# Patient Record
Sex: Male | Born: 1965 | Race: White | Hispanic: No | Marital: Married | Smoking: Never smoker
Health system: Southern US, Community
[De-identification: ages and names within clinical notes are randomized; demographics above are authoritative.]

## PROBLEM LIST (undated history)

## (undated) DIAGNOSIS — S83209A Unspecified tear of unspecified meniscus, current injury, unspecified knee, initial encounter: Secondary | ICD-10-CM

## (undated) DIAGNOSIS — I10 Essential (primary) hypertension: Secondary | ICD-10-CM

## (undated) DIAGNOSIS — E119 Type 2 diabetes mellitus without complications: Secondary | ICD-10-CM

## (undated) DIAGNOSIS — M1990 Unspecified osteoarthritis, unspecified site: Secondary | ICD-10-CM

## (undated) DIAGNOSIS — E785 Hyperlipidemia, unspecified: Secondary | ICD-10-CM

## (undated) HISTORY — DX: Hyperlipidemia, unspecified: E78.5

## (undated) HISTORY — DX: Essential (primary) hypertension: I10

## (undated) HISTORY — PX: KNEE ARTHROSCOPY: SHX127

---

## 2012-09-16 DIAGNOSIS — E119 Type 2 diabetes mellitus without complications: Secondary | ICD-10-CM

## 2012-09-16 HISTORY — DX: Type 2 diabetes mellitus without complications: E11.9

## 2013-02-16 DIAGNOSIS — S83209A Unspecified tear of unspecified meniscus, current injury, unspecified knee, initial encounter: Secondary | ICD-10-CM

## 2013-02-16 HISTORY — DX: Unspecified tear of unspecified meniscus, current injury, unspecified knee, initial encounter: S83.209A

## 2013-03-03 ENCOUNTER — Encounter (HOSPITAL_COMMUNITY): Payer: Self-pay | Admitting: Pharmacy Technician

## 2013-03-03 ENCOUNTER — Other Ambulatory Visit (HOSPITAL_COMMUNITY): Payer: Self-pay | Admitting: Orthopaedic Surgery

## 2013-03-05 ENCOUNTER — Encounter (HOSPITAL_COMMUNITY): Payer: Self-pay

## 2013-03-05 ENCOUNTER — Encounter (HOSPITAL_COMMUNITY)
Admission: RE | Admit: 2013-03-05 | Discharge: 2013-03-05 | Disposition: A | Discharge status: Home / Self Care | Payer: BC Managed Care – PPO | Source: Ambulatory Visit | Attending: Orthopaedic Surgery | Admitting: Orthopaedic Surgery

## 2013-03-05 ENCOUNTER — Other Ambulatory Visit (HOSPITAL_COMMUNITY): Payer: Self-pay | Admitting: *Deleted

## 2013-03-05 DIAGNOSIS — Z01812 Encounter for preprocedural laboratory examination: Secondary | ICD-10-CM | POA: Insufficient documentation

## 2013-03-05 DIAGNOSIS — Z01810 Encounter for preprocedural cardiovascular examination: Secondary | ICD-10-CM | POA: Insufficient documentation

## 2013-03-05 HISTORY — DX: Type 2 diabetes mellitus without complications: E11.9

## 2013-03-05 HISTORY — DX: Unspecified osteoarthritis, unspecified site: M19.90

## 2013-03-05 HISTORY — DX: Unspecified tear of unspecified meniscus, current injury, unspecified knee, initial encounter: S83.209A

## 2013-03-05 LAB — BASIC METABOLIC PANEL
BUN: 16 mg/dL (ref 6–23)
CO2: 28 mEq/L (ref 19–32)
Chloride: 102 mEq/L (ref 96–112)
GFR calc Af Amer: >90 mL/min (ref >90)
GFR calc non Af Amer: >90 mL/min (ref >90)
Potassium: 4.1 mEq/L (ref 3.5–5.1)
Sodium: 138 mEq/L (ref 135–145)

## 2013-03-05 LAB — CBC
HCT: 45.8 % (ref 39.0–52.0)
Hemoglobin: 16.3 g/dL (ref 13.0–17.0)
MCHC: 35.6 g/dL (ref 30.0–36.0)
RBC: 5.31 MIL/uL (ref 4.22–5.81)
RDW: 12.7 % (ref 11.5–15.5)
WBC: 4.9 10*3/uL (ref 4.0–10.5)

## 2013-03-05 NOTE — Pre-Procedure Instructions (Signed)
Jason Chang  03/05/2013   Your procedure is scheduled on:  Tuesday, March 10, 2013 at 11:30 AM.   Report to [facility] Entrance "A" Admitting Office at 9:30 AM.   Call this number if you have problems the morning of surgery: [phone]   Remember:   Do not eat food or drink liquids after midnight Monday, 03/09/13.   Take these medicines the morning of surgery with A SIP OF WATER: None   Do not wear jewelry.  Do not wear lotions, powders, or cologne. You may wear deodorant.  Men may shave face and neck.  Do not bring valuables to the hospital.  [facility] is not responsible                  for any belongings or valuables.               Contacts, dentures or bridgework may not be worn into surgery.  Leave suitcase in the car. After surgery it may be brought to your room.  For patients admitted to the hospital, discharge time is determined by your                treatment team.               Patients discharged the day of surgery will not be allowed to drive  home.  Name and phone number of your driver: Family/friend   Special Instructions: Shower using CHG 2 nights before surgery and the night before surgery.  If you shower the day of surgery use CHG.  Use special wash - you have one bottle of CHG for all showers.  You should use approximately 1/3 of the bottle for each shower.   Please read over the following fact sheets that you were given: Pain Booklet, Coughing and Deep Breathing and Surgical Site Infection Prevention

## 2013-03-09 MED ORDER — CEFAZOLIN SODIUM-DEXTROSE 2-3 GM-% IV SOLR
2.0000 g | INTRAVENOUS | Status: AC
  Administered 2013-03-10: 2 g via INTRAVENOUS
  Filled 2013-03-09: qty 50

## 2013-03-10 ENCOUNTER — Encounter (HOSPITAL_COMMUNITY): Payer: BC Managed Care – PPO | Admitting: Certified Registered Nurse Anesthetist

## 2013-03-10 ENCOUNTER — Encounter (HOSPITAL_COMMUNITY)
Admission: RE | Disposition: A | Discharge status: Home / Self Care | Payer: Self-pay | Source: Ambulatory Visit | Attending: Orthopaedic Surgery

## 2013-03-10 ENCOUNTER — Ambulatory Visit (HOSPITAL_COMMUNITY): Payer: BC Managed Care – PPO | Admitting: Certified Registered Nurse Anesthetist

## 2013-03-10 ENCOUNTER — Encounter (HOSPITAL_COMMUNITY): Payer: Self-pay | Admitting: Certified Registered Nurse Anesthetist

## 2013-03-10 ENCOUNTER — Ambulatory Visit (HOSPITAL_COMMUNITY)
Admission: RE | Admit: 2013-03-10 | Discharge: 2013-03-10 | Disposition: A | Discharge status: Home / Self Care | Payer: BC Managed Care – PPO | Source: Ambulatory Visit | Attending: Orthopaedic Surgery | Admitting: Orthopaedic Surgery

## 2013-03-10 DIAGNOSIS — IMO0002 Reserved for concepts with insufficient information to code with codable children: Secondary | ICD-10-CM | POA: Insufficient documentation

## 2013-03-10 DIAGNOSIS — E119 Type 2 diabetes mellitus without complications: Secondary | ICD-10-CM | POA: Insufficient documentation

## 2013-03-10 DIAGNOSIS — M2240 Chondromalacia patellae, unspecified knee: Secondary | ICD-10-CM | POA: Insufficient documentation

## 2013-03-10 DIAGNOSIS — S838X2A Sprain of other specified parts of left knee, initial encounter: Secondary | ICD-10-CM

## 2013-03-10 DIAGNOSIS — X58XXXA Exposure to other specified factors, initial encounter: Secondary | ICD-10-CM | POA: Insufficient documentation

## 2013-03-10 HISTORY — PX: KNEE ARTHROSCOPY: SHX127

## 2013-03-10 LAB — GLUCOSE, CAPILLARY
Glucose-Capillary: 157 mg/dL — ABNORMAL HIGH (ref 70–99)
Glucose-Capillary: 68 mg/dL — ABNORMAL LOW (ref 70–99)
Glucose-Capillary: 77 mg/dL (ref 70–99)
Glucose-Capillary: 119 mg/dL — ABNORMAL HIGH (ref 70–99)

## 2013-03-10 SURGERY — ARTHROSCOPY, KNEE
Anesthesia: General | Site: Knee | Laterality: Left

## 2013-03-10 MED ORDER — HYDROCODONE-ACETAMINOPHEN 5-325 MG PO TABS: 1.0000 | ORAL_TABLET | ORAL | Status: DC | PRN

## 2013-03-10 MED ORDER — CLONIDINE HCL (ANALGESIA) 100 MCG/ML EP SOLN
EPIDURAL | Status: DC | PRN
  Administered 2013-03-10: 1 mL

## 2013-03-10 MED ORDER — SODIUM CHLORIDE 0.9 % IR SOLN
Status: DC | PRN
  Administered 2013-03-10: 3000 mL

## 2013-03-10 MED ORDER — LIDOCAINE HCL (CARDIAC) 20 MG/ML IV SOLN
INTRAVENOUS | Status: DC | PRN
  Administered 2013-03-10: 80 mg via INTRAVENOUS

## 2013-03-10 MED ORDER — LACTATED RINGERS IV SOLN
INTRAVENOUS | Status: DC | PRN
  Administered 2013-03-10: 12:00:00 via INTRAVENOUS

## 2013-03-10 MED ORDER — MORPHINE SULFATE 4 MG/ML IJ SOLN
INTRAMUSCULAR | Status: AC
  Filled 2013-03-10: qty 1

## 2013-03-10 MED ORDER — PROMETHAZINE HCL 12.5 MG PO TABS
12.5000 mg | ORAL_TABLET | Freq: Four times a day (QID) | ORAL | Status: DC | PRN
Start: 2013-03-10 — End: 2015-07-21

## 2013-03-10 MED ORDER — BUPIVACAINE HCL (PF) 0.25 % IJ SOLN
INTRAMUSCULAR | Status: AC
  Filled 2013-03-10: qty 30

## 2013-03-10 MED ORDER — MORPHINE SULFATE 4 MG/ML IJ SOLN
INTRAMUSCULAR | Status: DC | PRN
  Administered 2013-03-10: 4 mg via SUBCUTANEOUS

## 2013-03-10 MED ORDER — HYDROMORPHONE HCL PF 1 MG/ML IJ SOLN: 0.2500 mg | INTRAMUSCULAR | Status: DC | PRN

## 2013-03-10 MED ORDER — BUPIVACAINE HCL (PF) 0.25 % IJ SOLN
INTRAMUSCULAR | Status: DC | PRN
  Administered 2013-03-10: 18 mL

## 2013-03-10 MED ORDER — PROPOFOL 10 MG/ML IV BOLUS
INTRAVENOUS | Status: DC | PRN
  Administered 2013-03-10: 200 mg via INTRAVENOUS

## 2013-03-10 MED ORDER — DEXTROSE 50 % IV SOLN
INTRAVENOUS | Status: AC
  Filled 2013-03-10: qty 50

## 2013-03-10 MED ORDER — LACTATED RINGERS IV SOLN
INTRAVENOUS | Status: DC
  Administered 2013-03-10: 10:00:00 via INTRAVENOUS

## 2013-03-10 MED ORDER — BUPIVACAINE-EPINEPHRINE (PF) 0.5% -1:200000 IJ SOLN
INTRAMUSCULAR | Status: AC
  Filled 2013-03-10: qty 10

## 2013-03-10 MED ORDER — CLONIDINE HCL (ANALGESIA) 100 MCG/ML EP SOLN
150.0000 ug | EPIDURAL | Status: DC
  Filled 2013-03-10: qty 1.5

## 2013-03-10 MED ORDER — DEXTROSE 50 % IV SOLN
1.0000 | Freq: Once | INTRAVENOUS | Status: AC
  Administered 2013-03-10: 50 mL via INTRAVENOUS

## 2013-03-10 MED ORDER — DEXTROSE 50 % IV SOLN
INTRAVENOUS | Status: AC
  Administered 2013-03-10: 10:00:00
  Filled 2013-03-10: qty 50

## 2013-03-10 MED ORDER — MIDAZOLAM HCL 5 MG/5ML IJ SOLN
INTRAMUSCULAR | Status: DC | PRN
  Administered 2013-03-10: 2 mg via INTRAVENOUS

## 2013-03-10 MED ORDER — FENTANYL CITRATE 0.05 MG/ML IJ SOLN
INTRAMUSCULAR | Status: DC | PRN
  Administered 2013-03-10: 50 ug via INTRAVENOUS
  Administered 2013-03-10: 25 ug via INTRAVENOUS
  Administered 2013-03-10: 50 ug via INTRAVENOUS

## 2013-03-10 SURGICAL SUPPLY — 39 items
BANDAGE ELASTIC 6 VELCRO ST LF (GAUZE/BANDAGES/DRESSINGS) ×2 IMPLANT
BANDAGE ESMARK 6X9 LF (GAUZE/BANDAGES/DRESSINGS) IMPLANT
BLADE CUDA 5.5 (BLADE) IMPLANT
BLADE CUTTER GATOR 3.5 (BLADE) ×2 IMPLANT
BLADE SURG 11 STRL SS (BLADE) IMPLANT
BLADE SURG ROTATE 9660 (MISCELLANEOUS) ×2 IMPLANT
BNDG ESMARK 6X9 LF (GAUZE/BANDAGES/DRESSINGS)
BUR OVAL 6.0 (BURR) IMPLANT
CLOTH BEACON ORANGE TIMEOUT ST (SAFETY) ×2 IMPLANT
COVER SURGICAL LIGHT HANDLE (MISCELLANEOUS) ×2 IMPLANT
CUFF TOURNIQUET SINGLE 34IN LL (TOURNIQUET CUFF) IMPLANT
CUFF TOURNIQUET SINGLE 44IN (TOURNIQUET CUFF) IMPLANT
DRAPE ARTHROSCOPY W/POUCH 114 (DRAPES) ×2 IMPLANT
DRAPE U-SHAPE 47X51 STRL (DRAPES) ×2 IMPLANT
DRSG PAD ABDOMINAL 8X10 ST (GAUZE/BANDAGES/DRESSINGS) ×2 IMPLANT
DURAPREP 26ML APPLICATOR (WOUND CARE) ×2 IMPLANT
GAUZE XEROFORM 1X8 LF (GAUZE/BANDAGES/DRESSINGS) ×2 IMPLANT
GLOVE BIO SURGEON STRL SZ8 (GLOVE) ×2 IMPLANT
GLOVE BIOGEL PI IND STRL 8 (GLOVE) ×1 IMPLANT
GLOVE BIOGEL PI INDICATOR 8 (GLOVE) ×1
GLOVE ORTHO TXT STRL SZ7.5 (GLOVE) ×2 IMPLANT
GOWN PREVENTION PLUS LG XLONG (DISPOSABLE) IMPLANT
GOWN PREVENTION PLUS XLARGE (GOWN DISPOSABLE) ×6 IMPLANT
GOWN STRL NON-REIN LRG LVL3 (GOWN DISPOSABLE) IMPLANT
KIT ROOM TURNOVER OR (KITS) ×2 IMPLANT
MANIFOLD NEPTUNE II (INSTRUMENTS) IMPLANT
NS IRRIG 1000ML POUR BTL (IV SOLUTION) IMPLANT
PACK ARTHROSCOPY DSU (CUSTOM PROCEDURE TRAY) ×2 IMPLANT
PAD ARMBOARD 7.5X6 YLW CONV (MISCELLANEOUS) ×4 IMPLANT
PADDING CAST COTTON 6X4 STRL (CAST SUPPLIES) ×2 IMPLANT
SET ARTHROSCOPY TUBING (MISCELLANEOUS) ×1
SET ARTHROSCOPY TUBING LN (MISCELLANEOUS) ×1 IMPLANT
SPONGE GAUZE 4X4 12PLY (GAUZE/BANDAGES/DRESSINGS) ×2 IMPLANT
SPONGE LAP 4X18 X RAY DECT (DISPOSABLE) ×2 IMPLANT
SUT ETHILON 3 0 PS 1 (SUTURE) ×2 IMPLANT
TOWEL OR 17X24 6PK STRL BLUE (TOWEL DISPOSABLE) ×2 IMPLANT
TOWEL OR 17X26 10 PK STRL BLUE (TOWEL DISPOSABLE) ×2 IMPLANT
WAND 90 DEG TURBOVAC W/CORD (SURGICAL WAND) IMPLANT
WATER STERILE IRR 1000ML POUR (IV SOLUTION) IMPLANT

## 2013-03-10 NOTE — Preoperative (Signed)
Beta Blockers   Reason not to administer Beta Blockers:Not Applicable 

## 2013-03-10 NOTE — Progress Notes (Signed)
Lunch relief by M. Brande RN 

## 2013-03-10 NOTE — Op Note (Signed)
[name], [name] NO.:  [id number]  MEDICAL RECORD NO.:  [id number]  LOCATION:  [facility]                         FACILITY:  [facility]  PHYSICIAN:  Vanita Panda. Magnus Ivan, M.D.DATE OF BIRTH:  30-Dec-1965  DATE OF PROCEDURE:  03/10/2013 DATE OF DISCHARGE:  03/10/2013                              OPERATIVE REPORT   PREOPERATIVE DIAGNOSIS:  Left knee complex medial meniscal tear, and medial compartment arthritic changes.  POSTOPERATIVE DIAGNOSIS:  Left knee complex medial meniscal tear, and medial compartment arthritic changes.  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  PROCEDURE:  Left knee arthroscopy with debridement and partial medial meniscectomy.  SURGEON:  Doneen Poisson, M.D.  ASSISTING:  Richardean Canal, P.A-C.  ANESTHESIA: 1. General. 2. Local with a mixture of morphine, Marcaine, and clonidine.  BLOOD LOSS:  Minimal.  COMPLICATIONS:  None.  INDICATION:  Jason Chang is a 47 year old active gentleman with a known medial meniscal tear involving a large section of the posterior horn and mid body of the medial meniscus with cartilage thinning on the medial compartment of the knee.  This has become symptomatic for him with locking and catching and knee effusion.  It was confirmed on MRI as well.  At this point, given the failure of conservative treatment, he wishes to proceed with an arthroscopic intervention.  I have explained to him in detail, the risks and benefits of surgery and he does wish to proceed.  PROCEDURE DESCRIPTION:  After informed consent was obtained, appropriate left knee was marked.  He was brought to the operating room, placed supine on the operating  table, general anesthesia was then obtained. His left leg was prepped and draped from the thigh down the ankle with DuraPrep and sterile drapes including sterile  stockinette.  With the bed raised and a lateral leg post utilized, the left knee was flexed off the side of the table.  We inserted an arthroscopic cannula into the knee and placed the camera, went directly to the medial compartment of the knee and found extensive tearing of the medial meniscus as well as cartilage loss on the medial femoral condyle.  Using the arthroscopic shaver, we carried out extensive debridement in the medial compartment of the knee and using up-cutting biters, we were able to perform a partial medial meniscectomy from the posterior root and horn to the mid body.  We did leave remaining meniscal tissue with a shaver also debrided this back to a stable margin and smoothed out the cartilage, went to the middle part intercondylar area of the knee and the ACL and PCL were probed and they were intact with the knee in a figure-of-four position, the lateral side was pristine.  Finally, in the patellofemoral joint, there was minimal chondromalacia of the trochlear groove centrally but this is minimal.  The patella, medial and lateral facets looked great.  We then allowed fluid to lavage to the knee and removed all remnant tissue that was floating around the knee from the knee.  We then inserted a mixture of morphine, clonidine and Marcaine into the knee and closed the portal sites with interrupted nylon  suture.  Xeroform and well-padded sterile dressings was applied.  He was awakened, extubated, and taken to recovery room in stable condition.  All final counts were correct.  There were no complications noted.     Vanita Panda. Magnus Ivan, M.D.     CYB/MEDQ  D:  03/10/2013  T:  03/10/2013  Job:  [id number]  cc:   Richardean Canal, P.A.

## 2013-03-10 NOTE — Anesthesia Procedure Notes (Signed)
Procedure Name: LMA Insertion Date/Time: 03/10/2013 12:12 PM Performed by: Angelica Pou Pre-anesthesia Checklist: Patient identified, Patient being monitored, Emergency Drugs available, Timeout performed and Suction available Patient Re-evaluated:Patient Re-evaluated prior to inductionOxygen Delivery Method: Circle system utilized Preoxygenation: Pre-oxygenation with 100% oxygen Intubation Type: IV induction Ventilation: Mask ventilation without difficulty LMA: LMA inserted LMA Size: 4.0 Number of attempts: 1 Tube secured with: Tape Dental Injury: Teeth and Oropharynx as per pre-operative assessment

## 2013-03-10 NOTE — Progress Notes (Signed)
CBG 53 upon adm to PACU. Dr Randa Evens notified. 1 amp D50 given per her orders. Will cont to monitor.

## 2013-03-10 NOTE — Transfer of Care (Signed)
Immediate Anesthesia Transfer of Care Note  Patient: Jason Chang  Procedure(s) Performed: Procedure(s): LEFT KNEE ARTHROSCOPY WITH PARTIAL MEDIAL MENISCECTOMY (Left)  Patient Location: PACU  Anesthesia Type:General  Level of Consciousness: awake, alert , oriented and patient cooperative  Airway & Oxygen Therapy: Patient Spontanous Breathing and Patient connected to nasal cannula oxygen  Post-op Assessment: Report given to PACU RN, Post -op Vital signs reviewed and stable and Patient moving all extremities  Post vital signs: Reviewed and stable  Complications: No apparent anesthesia complications

## 2013-03-10 NOTE — Anesthesia Postprocedure Evaluation (Signed)
  Anesthesia Post-op Note  Patient: Jason Chang  Procedure(s) Performed: Procedure(s): LEFT KNEE ARTHROSCOPY WITH PARTIAL MEDIAL MENISCECTOMY (Left)  Patient Location: PACU  Anesthesia Type:General  Level of Consciousness: awake  Airway and Oxygen Therapy: Patient Spontanous Breathing  Post-op Pain: mild  Post-op Assessment: Post-op Vital signs reviewed  Post-op Vital Signs: Reviewed  Complications: No apparent anesthesia complications

## 2013-03-10 NOTE — H&P (Signed)
Jason Chang is an 47 y.o. male.   Chief Complaint:   Left knee pain with locking/catching; known meniscal tear HPI:   47 yo male with a left knee injury in which a medial meniscal tear was found on MRI.  Due to his symptoms, a left knee arthroscopy has been recommended.  He understands the risk mainly of infection and DVT and wishes to proceed.  The goals are decreased symptoms and improve left knee function.  Past Medical History  Diagnosis Date  . Diabetes mellitus without complication 09/2012    diet controlled  . Arthritis   . Meniscus tear 02/2013    left    Past Surgical History  Procedure Laterality Date  . Knee arthroscopy Left     No family history on file. Social History:  reports that he has never smoked. He has never used smokeless tobacco. He reports that he does not drink alcohol or use illicit drugs.  Allergies: No Known Allergies  No prescriptions prior to admission    No results found for this or any previous visit (from the past 48 hour(s)). No results found.  Review of Systems  All other systems reviewed and are negative.    There were no vitals taken for this visit. Physical Exam  Constitutional: He is oriented to person, place, and time. He appears well-developed and well-nourished.  HENT:  Head: Normocephalic and atraumatic.  Eyes: EOM are normal. Pupils are equal, round, and reactive to light.  Neck: Normal range of motion. Neck supple.  Cardiovascular: Normal rate and regular rhythm.   Respiratory: Effort normal and breath sounds normal.  GI: Soft. Bowel sounds are normal.  Musculoskeletal:       Left knee: He exhibits effusion. Tenderness found. Medial joint line tenderness noted.  Neurological: He is alert and oriented to person, place, and time.  Skin: Skin is warm and dry.  Psychiatric: He has a normal mood and affect.     Assessment/Plan Left knee with symptomatic medial meniscal tear 1)  To the OR today for a left knee  arthroscopy with debridement and a partial medial meniscectomy as an outpatient  Sahily Biddle Y 03/10/2013, 7:56 AM

## 2013-03-10 NOTE — Brief Op Note (Signed)
03/10/2013  12:56 PM  PATIENT:  Jason Chang  47 y.o. male  PRE-OPERATIVE DIAGNOSIS:  Left knee medial meniscal tear  POST-OPERATIVE DIAGNOSIS:  Left knee medial meniscal tear  PROCEDURE:  Procedure(s): LEFT KNEE ARTHROSCOPY WITH PARTIAL MEDIAL MENISCECTOMY (Left)  SURGEON:  Surgeon(s) and Role:    * Kathryne Hitch, MD - Primary  PHYSICIAN ASSISTANT: Rexene Edison, PA-C  ANESTHESIA:   local and general  EBL:     BLOOD ADMINISTERED:none  DRAINS: none   LOCAL MEDICATIONS USED:  MARCAINE     SPECIMEN:  No Specimen  DISPOSITION OF SPECIMEN:  N/A  COUNTS:  YES  TOURNIQUET:  * No tourniquets in log *  DICTATION: .Other Dictation: Dictation Number [id number]  PLAN OF CARE: Discharge to home after PACU  PATIENT DISPOSITION:  PACU - hemodynamically stable.   Delay start of Pharmacological VTE agent (>24hrs) due to surgical blood loss or risk of bleeding: no

## 2013-03-10 NOTE — Progress Notes (Signed)
Dr.Edwards notified of accu check 68-orders received to give an Amp of D50

## 2013-03-10 NOTE — Anesthesia Preprocedure Evaluation (Signed)
Anesthesia Evaluation  Patient identified by MRN, date of birth, ID band Patient awake    Reviewed: Allergy & Precautions, H&P , NPO status , Patient's Chart, lab work & pertinent test results  Airway Mallampati: II      Dental   Pulmonary          Cardiovascular negative cardio ROS      Neuro/Psych    GI/Hepatic negative GI ROS, Neg liver ROS,   Endo/Other  diabetes  Renal/GU negative Renal ROS     Musculoskeletal   Abdominal   Peds  Hematology   Anesthesia Other Findings   Reproductive/Obstetrics                           Anesthesia Physical Anesthesia Plan  ASA: II  Anesthesia Plan: General   Post-op Pain Management:    Induction: Intravenous  Airway Management Planned: LMA  Additional Equipment:   Intra-op Plan:   Post-operative Plan: Extubation in OR  Informed Consent: I have reviewed the patients History and Physical, chart, labs and discussed the procedure including the risks, benefits and alternatives for the proposed anesthesia with the patient or authorized representative who has indicated his/her understanding and acceptance.   Dental advisory given  Plan Discussed with: CRNA, Anesthesiologist and Surgeon  Anesthesia Plan Comments:         Anesthesia Quick Evaluation

## 2013-03-11 ENCOUNTER — Encounter (HOSPITAL_COMMUNITY): Payer: Self-pay | Admitting: Orthopaedic Surgery

## 2015-07-18 ENCOUNTER — Encounter: Payer: Self-pay | Admitting: Family Medicine

## 2015-07-21 ENCOUNTER — Encounter: Payer: Self-pay | Admitting: Family Medicine

## 2015-07-21 ENCOUNTER — Ambulatory Visit (INDEPENDENT_AMBULATORY_CARE_PROVIDER_SITE_OTHER): Payer: BC Managed Care – PPO | Admitting: Family Medicine

## 2015-07-21 VITALS — BP 170/103 | HR 64 | Temp 97.9°F | Wt 240.1 lb

## 2015-07-21 DIAGNOSIS — IMO0001 Reserved for inherently not codable concepts without codable children: Secondary | ICD-10-CM

## 2015-07-21 DIAGNOSIS — Z8639 Personal history of other endocrine, nutritional and metabolic disease: Secondary | ICD-10-CM | POA: Diagnosis not present

## 2015-07-21 DIAGNOSIS — E119 Type 2 diabetes mellitus without complications: Secondary | ICD-10-CM | POA: Insufficient documentation

## 2015-07-21 DIAGNOSIS — Z0000 Encounter for general adult medical examination without abnormal findings: Secondary | ICD-10-CM | POA: Diagnosis not present

## 2015-07-21 DIAGNOSIS — R030 Elevated blood-pressure reading, without diagnosis of hypertension: Secondary | ICD-10-CM | POA: Insufficient documentation

## 2015-07-21 DIAGNOSIS — M79641 Pain in right hand: Secondary | ICD-10-CM | POA: Diagnosis not present

## 2015-07-21 DIAGNOSIS — M25561 Pain in right knee: Secondary | ICD-10-CM

## 2015-07-21 DIAGNOSIS — M79642 Pain in left hand: Secondary | ICD-10-CM

## 2015-07-21 LAB — CBC WITH DIFFERENTIAL/PLATELET
BASOS ABS: 56 {cells}/uL (ref 0–200)
Basophils Relative: 1 %
EOS ABS: 168 {cells}/uL (ref 15–500)
Eosinophils Relative: 3 %
HCT: 47.6 % (ref 38.5–50.0)
Hemoglobin: 16.3 g/dL (ref 13.2–17.1)
Lymphocytes Relative: 29 %
Lymphs Abs: 1624 cells/uL (ref 850–3900)
MCH: 29.6 pg (ref 27.0–33.0)
MCHC: 34.2 g/dL (ref 32.0–36.0)
MCV: 86.4 fL (ref 80.0–100.0)
MONOS PCT: 6 %
MPV: 10.2 fL (ref 7.5–12.5)
Monocytes Absolute: 336 cells/uL (ref 200–950)
NEUTROS PCT: 61 %
Neutro Abs: 3416 cells/uL (ref 1500–7800)
Platelets: 183 10*3/uL (ref 140–400)
RBC: 5.51 MIL/uL (ref 4.20–5.80)
RDW: 13.4 % (ref 11.0–15.0)
WBC: 5.6 10*3/uL (ref 3.8–10.8)

## 2015-07-21 LAB — COMPLETE METABOLIC PANEL WITH GFR
ALBUMIN: 4.6 g/dL (ref 3.6–5.1)
ALK PHOS: 42 U/L (ref 40–115)
ALT: 33 U/L (ref 9–46)
AST: 28 U/L (ref 10–35)
BILIRUBIN TOTAL: 0.9 mg/dL (ref 0.2–1.2)
BUN: 17 mg/dL (ref 7–25)
CO2: 28 mmol/L (ref 20–31)
CREATININE: 0.98 mg/dL (ref 0.70–1.33)
Calcium: 9.6 mg/dL (ref 8.6–10.3)
Chloride: 100 mmol/L (ref 98–110)
GFR, Est African American: >89 mL/min (ref >=60)
GFR, Est Non African American: 89 mL/min (ref >=60)
GLUCOSE: 94 mg/dL (ref 65–99)
Potassium: 4.3 mmol/L (ref 3.5–5.3)
SODIUM: 138 mmol/L (ref 135–146)
Total Protein: 6.7 g/dL (ref 6.1–8.1)

## 2015-07-21 LAB — LIPID PANEL
CHOLESTEROL: 204 mg/dL — AB (ref 125–200)
HDL: 42 mg/dL (ref >=40)
LDL Cholesterol: 145 mg/dL — ABNORMAL HIGH (ref <130)
Total CHOL/HDL Ratio: 4.9 Ratio (ref <=5.0)
Triglycerides: 83 mg/dL (ref <150)
VLDL: 17 mg/dL (ref <30)

## 2015-07-21 LAB — POCT GLYCOSYLATED HEMOGLOBIN (HGB A1C): HEMOGLOBIN A1C: 5.9

## 2015-07-21 NOTE — Assessment & Plan Note (Signed)
Most consistent with OA. -trial of Tylenol and Iceing

## 2015-07-21 NOTE — Assessment & Plan Note (Signed)
Elevated blood pressure today. As first visit can not definitively call HTN.  -RN visit for BP check in one week -check basic labs CMP/CBC/Lipids

## 2015-07-21 NOTE — Assessment & Plan Note (Signed)
Known history of DM. -check A1C today

## 2015-07-21 NOTE — Assessment & Plan Note (Signed)
GI referral for colonoscopy.  Up to date on immunizations.

## 2015-07-21 NOTE — Patient Instructions (Signed)
It was nice to see you today.  Blood pressure - elevated, schedule nurse visit in 1 week for blood pressure check  Arthritis of hands/right knee - start Tylenol 500 mg twice daily, ice the affected areas for 10-15 minutes 1-2 times per day  History of Diabetes - check labs  Return in 1 month for blood pressure check and discussion on weight/exercise.

## 2015-07-21 NOTE — Assessment & Plan Note (Signed)
Likely OA. Ligament testing intact. Symptoms very minimal at this point. -trial of Tylenol and icing -consider Xray of knee if symptoms persist

## 2015-07-21 NOTE — Progress Notes (Signed)
   Subjective:    Patient ID: Jason Chang, male    DOB: 07-16-1965, 50 y.o.   MRN: [id number]  HPI 50 y/o male presents for new patient visit.  Reviewed New Patient Health History. Updated PMH/PSH/Medications/FH/Social Hx./Allergies in EPIC.  Acute concerns: 1. History of diabetes - no current medications, patient would like lab work today 2. 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 Intermittent right knee pain - over 2-3 weeks, 1-2/10 not taking any otc medications, no locking, no popping, no giving out  Health Maintenance - patient requests referral to GI for colonoscopy  Review of Systems  Constitutional: Negative for fever, chills and fatigue.  Respiratory: Negative for cough and shortness of breath.   Cardiovascular: Negative for chest pain.  Gastrointestinal: Negative for nausea, vomiting and diarrhea.  Neurological: Negative for dizziness, light-headedness and headaches.       Objective:   Physical Exam BP 170/103 mmHg  Pulse 64  Temp(Src) 97.9 F (36.6 C) (Oral)  Wt 240 lb 1.6 oz (108.909 kg)  Gen: pleasant male, NAD HEENT: normocephalic, PERRL, EOMI, no scleral icterus, MMM, neck supple, no adenopathy, no thyromegaly Cardiac: RRR, S1 and S2 present, no murmur Resp: CTAB, normal effort Abd: soft, no tenderness, normal bowel sounds [id number] Hands - mild tenderness of PIP joints in all fingers, no swelling, no redness/warmth; Right knee - no swelling, no crepitus, no tenderness, lachman/varus stress/valgus stress all within normal limits/good endpoint, also good endpoint with anterior drawer test, McMurray negative Ext: no edema, 2+ radial pulses bilaterally     Assessment & Plan:  Elevated blood pressure Elevated blood pressure today. As first visit can not definitively call HTN.  -RN visit for BP check in one week -check basic  labs CMP/CBC/Lipids  Hx of diabetes mellitus Known history of DM. -check A1C today  Bilateral hand pain Most consistent with OA. -trial of Tylenol and Iceing  Right knee pain Likely OA. Ligament testing intact. Symptoms very minimal at this point. -trial of Tylenol and icing -consider Xray of knee if symptoms persist

## 2015-07-22 LAB — HIV ANTIBODY (ROUTINE TESTING W REFLEX): HIV 1&2 Ab, 4th Generation: NONREACTIVE

## 2015-07-22 LAB — HEPATITIS C ANTIBODY: HCV Ab: NEGATIVE

## 2015-07-27 ENCOUNTER — Telehealth: Payer: Self-pay | Admitting: Family Medicine

## 2015-07-27 DIAGNOSIS — E785 Hyperlipidemia, unspecified: Secondary | ICD-10-CM | POA: Insufficient documentation

## 2015-07-27 NOTE — Telephone Encounter (Signed)
Jason Chang MS3 called patient and dicussed lab results. ASCVD 7.6%. Discussed starting ASA/Statin. Patient wished to discuss in more detail at upcoming appointment.

## 2015-07-28 ENCOUNTER — Ambulatory Visit (INDEPENDENT_AMBULATORY_CARE_PROVIDER_SITE_OTHER): Payer: BC Managed Care – PPO | Admitting: *Deleted

## 2015-07-28 VITALS — BP 142/80 | HR 76

## 2015-07-28 DIAGNOSIS — Z0130 Encounter for examination of blood pressure without abnormal findings: Secondary | ICD-10-CM

## 2015-07-28 DIAGNOSIS — Z136 Encounter for screening for cardiovascular disorders: Secondary | ICD-10-CM

## 2015-07-28 NOTE — Progress Notes (Signed)
   Patient in nurse clinic for blood pressure check.  Patient denies any symptoms today.  Will forward to PCP.  Derl Barrow, RN  Today's Vitals   07/28/15 0855  BP: 142/80  Pulse: 76  SpO2: 99%  PainSc: 0-No pain

## 2015-07-29 NOTE — Progress Notes (Signed)
Discussed blood pressure with patient. Discussed BP med vs lifestyle modification. Patient elected to start exercising and eating a healthy diet. Will recheck at next visit.

## 2015-08-13 ENCOUNTER — Emergency Department (HOSPITAL_COMMUNITY): Payer: BC Managed Care – PPO

## 2015-08-13 ENCOUNTER — Emergency Department (HOSPITAL_COMMUNITY)
Admission: EM | Admit: 2015-08-13 | Discharge: 2015-08-13 | Disposition: A | Discharge status: Home / Self Care | Payer: BC Managed Care – PPO | Attending: Emergency Medicine | Admitting: Emergency Medicine

## 2015-08-13 ENCOUNTER — Encounter (HOSPITAL_COMMUNITY): Payer: Self-pay

## 2015-08-13 DIAGNOSIS — M25551 Pain in right hip: Secondary | ICD-10-CM | POA: Diagnosis present

## 2015-08-13 DIAGNOSIS — M7061 Trochanteric bursitis, right hip: Secondary | ICD-10-CM | POA: Diagnosis not present

## 2015-08-13 DIAGNOSIS — Y9301 Activity, walking, marching and hiking: Secondary | ICD-10-CM | POA: Insufficient documentation

## 2015-08-13 DIAGNOSIS — R52 Pain, unspecified: Secondary | ICD-10-CM

## 2015-08-13 DIAGNOSIS — E119 Type 2 diabetes mellitus without complications: Secondary | ICD-10-CM | POA: Diagnosis not present

## 2015-08-13 MED ORDER — IBUPROFEN 400 MG PO TABS
400.0000 mg | ORAL_TABLET | Freq: Once | ORAL | Status: AC
  Administered 2015-08-13: 400 mg via ORAL
  Filled 2015-08-13: qty 1

## 2015-08-13 NOTE — ED Notes (Signed)
Rt. Hip/groin pain , intermittent sharp twinges.    The pain began after riding his bike.   The pain appears to be increased at rest or standing still. No injuries noted, no redness or swelling noted.

## 2015-08-13 NOTE — ED Provider Notes (Signed)
CSN: [id number]     Arrival date & time 08/13/15  [id number] History   First MD Initiated Contact with Patient 08/13/15 [phone]     Chief Complaint  Patient presents with  . Hip Pain     (Consider location/radiation/quality/duration/timing/severity/associated sxs/prior Treatment) Patient is a 50 y.o. male presenting with hip pain. The history is provided by the patient.  Hip Pain  Patient c/o dull pain right lateral hip, onset approximately 5 days ago, after riding bike. States had not been doing much exercise, and just started getting back into it.  Also states had plantar fasciitis in right foot, and may have been walking awkwardly on right leg.  Pain dull, constant, moderate, at times worse w certain positions, or change of positions. No leg swelling. No numbness/weakness.  Pain located right hip, esp laterally. No low back or radicular pain. No numbness/weakness. No fever. No skin changes, lesions. No knee pain.      Past Medical History  Diagnosis Date  . Diabetes mellitus without complication ([place]) 123456    diet controlled  . Arthritis   . Meniscus tear 02/2013    left   Past Surgical History  Procedure Laterality Date  . Knee arthroscopy Left   . Knee arthroscopy Left 03/10/2013    Procedure: LEFT KNEE ARTHROSCOPY WITH PARTIAL MEDIAL MENISCECTOMY;  Surgeon: Mcarthur Rossetti, MD;  Location: [facility];  Service: Orthopedics;  Laterality: Left;   Family History  Problem Relation Age of Onset  . Dementia Mother   . Hyperlipidemia Mother   . Hypertension Mother   . Osteoporosis Mother   . Stroke Mother   . Hyperlipidemia Father   . Hypertension Father   . Stroke Father   . Diabetes Brother    Social History  Substance Use Topics  . Smoking status: Never Smoker   . Smokeless tobacco: Never Used  . Alcohol Use: No    Review of Systems  Constitutional: Negative for fever and chills.  Cardiovascular: Negative for leg swelling.  Musculoskeletal: Negative for back pain.   Skin: Negative for rash and wound.  Neurological: Negative for weakness and numbness.      Allergies  Review of patient's allergies indicates no known allergies.  Home Medications   Prior to Admission medications   Not on File   BP 170/97 mmHg  Pulse 78  Temp(Src) 97.8 F (36.6 C) (Oral)  Resp 16  Ht 6' (1.829 m)  Wt 108.863 kg  BMI 32.54 kg/m2  SpO2 99% Physical Exam  Constitutional: He appears well-developed and well-nourished. No distress.  HENT:  Head: Atraumatic.  Eyes: Conjunctivae are normal. No scleral icterus.  Neck: Neck supple.  Cardiovascular: Intact distal pulses.   Pulmonary/Chest: Effort normal. No accessory muscle usage. No respiratory distress.  Musculoskeletal: Normal range of motion. He exhibits no edema or tenderness.  LS spine non tender, aligned. No good rom at right hip and knee without pain. Distal pulses palp RLE.  No swelling to leg. No skin changes, erythema or rash. No mass felt.   Neurological: He is alert.  Motor intact bil. Steady gait.   Skin: Skin is warm and dry. No rash noted. He is not diaphoretic.  Psychiatric: He has a normal mood and affect.  Nursing note and vitals reviewed.   ED Course  Procedures (including critical care time)  Dg Hip Unilat With Pelvis 2-3 Views Right  08/13/2015  CLINICAL DATA:  Right hip pain after riding bike. EXAM: DG HIP (WITH OR WITHOUT PELVIS) 2-3V RIGHT  COMPARISON:  None. FINDINGS: There is no evidence of hip fracture or dislocation. There is no evidence of arthropathy or other focal bone abnormality. IMPRESSION: Normal right hip. Electronically Signed   By: Marijo Conception, M.D.   On: 08/13/2015 08:22     I have personally reviewed and evaluated these images as part of my medical decision-making.   MDM   Reviewed nursing notes and prior charts for additional history.   Imaging study ordered.  Motrin po.  xrays neg, discussed w pt.   Symptoms/exam felt most c/w musculoskeletal  strain/pain, possible bursitis, no findings infection of septic process.   Pt appears stable for d/c.      Lajean Saver, MD 08/13/15 [phone]

## 2015-08-13 NOTE — Discharge Instructions (Signed)
It was our pleasure to provide your ER care today - we hope that you feel better.  Take motrin or aleve as need for pain.  Follow up with your doctor in the next 1-2 weeks if symptoms fail to improve/resolve.  Your blood pressure is high today - limit salt intake, and follow up with your doctor in the next 1-2 weeks for recheck of blood pressure.   Return to ER if worse, new symptoms, severe pain, numbness/weakness, leg swelling, fevers, other concern.     Hip Pain Your hip is the joint between your upper legs and your lower pelvis. The bones, cartilage, tendons, and muscles of your hip joint perform a lot of work each day supporting your body weight and allowing you to move around. Hip pain can range from a minor ache to severe pain in one or both of your hips. Pain may be felt on the inside of the hip joint near the groin, or the outside near the buttocks and upper thigh. You may have swelling or stiffness as well.  HOME CARE INSTRUCTIONS   Take medicines only as directed by your health care provider.  Apply ice to the injured area:  Put ice in a plastic bag.  Place a towel between your skin and the bag.  Leave the ice on for 15-20 minutes at a time, 3-4 times a day.  Keep your leg raised (elevated) when possible to lessen swelling.  Avoid activities that cause pain.  Follow specific exercises as directed by your health care provider.  Sleep with a pillow between your legs on your most comfortable side.  Record how often you have hip pain, the location of the pain, and what it feels like. SEEK MEDICAL CARE IF:   You are unable to put weight on your leg.  Your hip is red or swollen or very tender to touch.  Your pain or swelling continues or worsens after 1 week.  You have increasing difficulty walking.  You have a fever. SEEK IMMEDIATE MEDICAL CARE IF:   You have fallen.  You have a sudden increase in pain and swelling in your hip. MAKE SURE YOU:   Understand  these instructions.  Will watch your condition.  Will get help right away if you are not doing well or get worse.   This information is not intended to replace advice given to you by your health care provider. Make sure you discuss any questions you have with your health care provider.   Document Released: 08/23/2009 Document Revised: 03/26/2014 Document Reviewed: 10/30/2012 Elsevier Interactive Patient Education 2016 Else[employer] Inc[employer]    Hip Bursitis Bursitis is a swelling and soreness (inflammation) of a fluid-filled sac (bursa). This sac overlies and protects the joints.  CAUSES   Injury.  Overuse of the muscles surrounding the joint.  Arthritis.  Gout.  Infection.  Cold weather.  Inadequate warm-up and conditioning prior to activities. The cause may not be known.  SYMPTOMS   Mild to severe irritation.  Tenderness and swelling over the outside of the hip.  Pain with motion of the hip.  If the bursa becomes infected, a fever may be present. Redness, tenderness, and warmth will develop over the hip. Symptoms usually lessen in 3 to 4 weeks with treatment, but can come back. TREATMENT If conservative treatment does not work, your caregiver may advise draining the bursa and injecting cortisone into the area. This may speed up the healing process. This may also be used as an initial treatment  of choice. HOME CARE INSTRUCTIONS   Apply ice to the affected area for 15-20 minutes every 3 to 4 hours while awake for the first 2 days. Put the ice in a plastic bag and place a towel between the bag of ice and your skin.  Rest the painful joint as much as possible, but continue to put the joint through a normal range of motion at least 4 times per day. When the pain lessens, begin normal, slow movements and usual activities to help prevent stiffness of the hip.  Only take over-the-counter or prescription medicines for pain, discomfort, or fever as directed by your caregiver.  Use  crutches to limit weight bearing on the hip joint, if advised.  Elevate your painful hip to reduce swelling. Use pillows for propping and cushioning your legs and hips.  Gentle massage may provide comfort and decrease swelling. SEEK IMMEDIATE MEDICAL CARE IF:   Your pain increases even during treatment, or you are not improving.  You have a fever.  You have heat and inflammation over the involved bursa.  You have any other questions or concerns. MAKE SURE YOU:   Understand these instructions.  Will watch your condition.  Will get help right away if you are not doing well or get worse.   This information is not intended to replace advice given to you by your health care provider. Make sure you discuss any questions you have with your health care provider.   Document Released: 08/25/2001 Document Revised: 05/28/2011 Document Reviewed: 10/05/2014 Elsevier Interactive Patient Education 2016 [place] DASH stands for "Dietary Approaches to Stop Hypertension." The DASH eating plan is a healthy eating plan that has been shown to reduce high blood pressure (hypertension). Additional health benefits may include reducing the risk of type 2 diabetes mellitus, heart disease, and stroke. The DASH eating plan may also help with weight loss. WHAT DO I NEED TO KNOW ABOUT THE DASH EATING PLAN? For the DASH eating plan, you will follow these general guidelines:  Choose foods with a percent daily value for sodium of less than 5% (as listed on the food label).  Use salt-free seasonings or herbs instead of table salt or sea salt.  Check with your health care provider or pharmacist before using salt substitutes.  Eat lower-sodium products, often labeled as "lower sodium" or "no salt added."  Eat fresh foods.  Eat more vegetables, fruits, and low-fat dairy products.  Choose whole grains. Look for the word "whole" as the first word in the ingredient list.  Choose fish  and skinless chicken or [place] more often than red meat. Limit fish, poultry, and meat to 6 oz (170 g) each day.  Limit sweets, desserts, sugars, and sugary drinks.  Choose heart-healthy fats.  Limit cheese to 1 oz (28 g) per day.  Eat more home-cooked food and less restaurant, buffet, and fast food.  Limit fried foods.  Cook foods using methods other than frying.  Limit canned vegetables. If you do use them, rinse them well to decrease the sodium.  When eating at a restaurant, ask that your food be prepared with less salt, or no salt if possible. WHAT FOODS CAN I EAT? Seek help from a dietitian for individual calorie needs. Grains Whole grain or whole wheat bread. Brown rice. Whole grain or whole wheat pasta. Quinoa, bulgur, and whole grain cereals. Low-sodium cereals. Corn or whole wheat flour tortillas. Whole grain cornbread. Whole grain crackers. Low-sodium crackers. Vegetables Fresh or  frozen vegetables (raw, steamed, roasted, or grilled). Low-sodium or reduced-sodium tomato and vegetable juices. Low-sodium or reduced-sodium tomato sauce and paste. Low-sodium or reduced-sodium canned vegetables.  Fruits All fresh, canned (in natural juice), or frozen fruits. Meat and Other Protein Products Ground beef (85% or leaner), grass-fed beef, or beef trimmed of fat. Skinless chicken or [place]. Ground chicken or [place]. Pork trimmed of fat. All fish and seafood. Eggs. Dried beans, peas, or lentils. Unsalted nuts and seeds. Unsalted canned beans. Dairy Low-fat dairy products, such as skim or 1% milk, 2% or reduced-fat cheeses, low-fat ricotta or cottage cheese, or plain low-fat yogurt. Low-sodium or reduced-sodium cheeses. Fats and Oils Tub margarines without trans fats. Light or reduced-fat mayonnaise and salad dressings (reduced sodium). Avocado. Safflower, olive, or canola oils. Natural peanut or almond butter. Other Unsalted popcorn and pretzels. The items listed above may not be a  complete list of recommended foods or beverages. Contact your dietitian for more options. WHAT FOODS ARE NOT RECOMMENDED? Grains White bread. White pasta. White rice. Refined cornbread. Bagels and croissants. Crackers that contain trans fat. Vegetables Creamed or fried vegetables. Vegetables in a cheese sauce. Regular canned vegetables. Regular canned tomato sauce and paste. Regular tomato and vegetable juices. Fruits Dried fruits. Canned fruit in light or heavy syrup. Fruit juice. Meat and Other Protein Products Fatty cuts of meat. Ribs, chicken wings, bacon, sausage, bologna, salami, chitterlings, fatback, hot dogs, bratwurst, and packaged luncheon meats. Salted nuts and seeds. Canned beans with salt. Dairy Whole or 2% milk, cream, half-and-half, and cream cheese. Whole-fat or sweetened yogurt. Full-fat cheeses or blue cheese. Nondairy creamers and whipped toppings. Processed cheese, cheese spreads, or cheese curds. Condiments Onion and garlic salt, seasoned salt, table salt, and sea salt. Canned and packaged gravies. Worcestershire sauce. Tartar sauce. Barbecue sauce. Teriyaki sauce. Soy sauce, including reduced sodium. Steak sauce. Fish sauce. Oyster sauce. Cocktail sauce. Horseradish. Ketchup and mustard. Meat flavorings and tenderizers. Bouillon cubes. Hot sauce. Tabasco sauce. Marinades. Taco seasonings. Relishes. Fats and Oils Butter, stick margarine, lard, shortening, ghee, and bacon fat. Coconut, palm kernel, or palm oils. Regular salad dressings. Other Pickles and olives. Salted popcorn and pretzels. The items listed above may not be a complete list of foods and beverages to avoid. Contact your dietitian for more information. WHERE CAN I FIND MORE INFORMATION? National Heart, Lung, and Blood Institute: travelstabloid.com   This information is not intended to replace advice given to you by your health care provider. Make sure you discuss any questions  you have with your health care provider.   Document Released: 02/22/2011 Document Revised: 03/26/2014 Document Reviewed: 01/07/2013 Elsevier Interactive Patient Education [employer].

## 2015-08-13 NOTE — ED Notes (Signed)
Patient transported to X-ray 

## 2015-08-18 ENCOUNTER — Ambulatory Visit (INDEPENDENT_AMBULATORY_CARE_PROVIDER_SITE_OTHER): Payer: BC Managed Care – PPO | Admitting: Family Medicine

## 2015-08-18 VITALS — BP 132/88 | HR 55 | Temp 97.4°F | Ht 72.0 in | Wt 241.0 lb

## 2015-08-18 DIAGNOSIS — R030 Elevated blood-pressure reading, without diagnosis of hypertension: Secondary | ICD-10-CM

## 2015-08-18 DIAGNOSIS — L570 Actinic keratosis: Secondary | ICD-10-CM | POA: Insufficient documentation

## 2015-08-18 DIAGNOSIS — IMO0001 Reserved for inherently not codable concepts without codable children: Secondary | ICD-10-CM

## 2015-08-18 DIAGNOSIS — Z0000 Encounter for general adult medical examination without abnormal findings: Secondary | ICD-10-CM | POA: Diagnosis not present

## 2015-08-18 DIAGNOSIS — D489 Neoplasm of uncertain behavior, unspecified: Secondary | ICD-10-CM | POA: Insufficient documentation

## 2015-08-18 DIAGNOSIS — M25551 Pain in right hip: Secondary | ICD-10-CM | POA: Insufficient documentation

## 2015-08-18 NOTE — Assessment & Plan Note (Signed)
Patient had right hip pain last week after exercising. Seen in ED. Xrays negative. Pain resolved. Exam negative. -conservative therapy with NSAID's

## 2015-08-18 NOTE — Progress Notes (Signed)
50 y.o. year old male presents for preventative visit and annual physical.   Acute Concerns: 1. Follow up BP - mild elevation at last nurse visit and office visit; also elevated at recent ED visit 2. Right Hip Pain - seen in ED a few days ago, Xrays negative; was out riding bike 1.5 weeks ago (first time riding bide for a while), developed pain in lateral leg and into groin; pain has resolved (except for mild pain at end of the day).   Diet: trying to avoid salt in diet (however does eat mixed nuts), trying to cut out fast food, cut out soda 2 years ago, 2-3 servings fruits/vegetables per day, cutting down on red meat, eating carnation breakfast for the past few weeks  Exercise: tried to start riding bike however has not exercised much lately due to leg pain (see above).   Sexual History: reports decreased erection strength, able to orgasm every time, still feels like having sex  POA/Living Will: No living will  Social:  Social History   Social History  . Marital Status: Single    Spouse Name: N/A  . Number of Children: N/A  . Years of Education: N/A   Social History Main Topics  . Smoking status: Never Smoker   . Smokeless tobacco: Never Used  . Alcohol Use: No  . Drug Use: No  . Sexual Activity:    Partners: Female   Other Topics Concern  . Not on file   Social History Narrative   Married: Jason Chang    Occupation: [employer]   Religion: Christian       Immunization: Immunization History  Administered Date(s) Administered  . DTaP 03/19/2013  . Td 08/22/2007    Cancer Screening:  Colonoscopy: Referred to GI at last visit.    Prostate: good stream, no stopping/starting, wakes up only 0-1 times per night, no FH of prostate cancer   Physical Exam: VITALS: Reviewed GEN: Pleasant male, NAD HEENT: Normocephalic, PERRL, EOMI, no scleral icterus, bilateral TM pearly grey, nasal septum midline, MMM, uvula midline, no anterior or posterior lymphadenopathy, no  thyromegaly CARDIAC:RRR, S1 and S2 present, no murmur, no heaves/thrills RESP: CTAB, normal effort ABD: soft, no tenderness, normal bowel sounds GU/GYN: Not examined EXT: No edema, 2+ radial and DP pulses SKIN: 9 mm by 9 mm scaling rash right forearm. Left ear AK. No other skin lesions noted.  MSK: Right Hip - no tenderness, negative log roll, negative FADIR/FABER  ASSESSMENT & PLAN: 50 y.o. male presents for annual preventative exam. Please see problem specific assessment and plan.   Preventative health care 50 y/o male presents for preventative exam. -up to date on immunizations -scheduled for colonoscopy -no prostate/BPH symptoms (defer DRE/PSA) -provided information on living will/poa -encouraged healthy diet and regular exercise  Elevated blood pressure Blood pressure below treatment threshold of JNC-8. -continue healthy diet and regular exercise  Neoplasm of uncertain behavior 9 mm by 9 mm scaling rash of right forearm. AK vs SCC. -punch biopsy at next visit  AK (actinic keratosis) AK of left ear -cryotherapy at next visit  Right hip pain Patient had right hip pain last week after exercising. Seen in ED. Xrays negative. Pain resolved. Exam negative. -conservative therapy with NSAID's

## 2015-08-18 NOTE — Assessment & Plan Note (Signed)
50 y/o male presents for preventative exam. -up to date on immunizations -scheduled for colonoscopy -no prostate/BPH symptoms (defer DRE/PSA) -provided information on living will/poa -encouraged healthy diet and regular exercise

## 2015-08-18 NOTE — Assessment & Plan Note (Signed)
Blood pressure below treatment threshold of JNC-8. -continue healthy diet and regular exercise

## 2015-08-18 NOTE — Assessment & Plan Note (Signed)
9 mm by 9 mm scaling rash of right forearm. AK vs SCC. -punch biopsy at next visit

## 2015-08-18 NOTE — Patient Instructions (Signed)
It was nice to see you today.  Please get your colonoscopy.  Blood Pressure - improved on recheck, no medications at this time, recheck in one month  Right wrist skin lesion - return in one month for excision right wrist.   Continue to eat healthy and work on exercising regularly.

## 2015-08-18 NOTE — Assessment & Plan Note (Signed)
AK of left ear -cryotherapy at next visit

## 2015-09-16 ENCOUNTER — Ambulatory Visit: Payer: BC Managed Care – PPO | Admitting: Family Medicine

## 2015-09-16 ENCOUNTER — Ambulatory Visit (AMBULATORY_SURGERY_CENTER): Payer: Self-pay

## 2015-09-16 VITALS — Ht 72.0 in | Wt 242.0 lb

## 2015-09-16 DIAGNOSIS — Z1211 Encounter for screening for malignant neoplasm of colon: Secondary | ICD-10-CM

## 2015-09-16 MED ORDER — NA SULFATE-K SULFATE-MG SULF 17.5-3.13-1.6 GM/177ML PO SOLN: 1.0000 | Freq: Once | ORAL | Status: DC

## 2015-09-16 NOTE — Progress Notes (Signed)
No egg or soy allergy.  No previous complications from anesthesia. No home O2. No diet meds. 

## 2015-09-30 ENCOUNTER — Encounter: Payer: BC Managed Care – PPO | Admitting: Gastroenterology

## 2015-10-06 ENCOUNTER — Encounter: Payer: Self-pay | Admitting: Family Medicine

## 2015-10-06 ENCOUNTER — Ambulatory Visit (INDEPENDENT_AMBULATORY_CARE_PROVIDER_SITE_OTHER): Payer: BC Managed Care – PPO | Admitting: Family Medicine

## 2015-10-06 VITALS — BP 152/86 | HR 67 | Temp 98.5°F | Ht 72.0 in | Wt 236.2 lb

## 2015-10-06 DIAGNOSIS — R030 Elevated blood-pressure reading, without diagnosis of hypertension: Secondary | ICD-10-CM | POA: Diagnosis not present

## 2015-10-06 DIAGNOSIS — IMO0001 Reserved for inherently not codable concepts without codable children: Secondary | ICD-10-CM

## 2015-10-06 DIAGNOSIS — L570 Actinic keratosis: Secondary | ICD-10-CM | POA: Diagnosis not present

## 2015-10-06 MED ORDER — HYDROCHLOROTHIAZIDE 12.5 MG PO TABS: 12.5000 mg | ORAL_TABLET | Freq: Every day | ORAL | Status: DC

## 2015-10-06 NOTE — Patient Instructions (Signed)
Mr. Jason Chang, you were seen today for follow-up of your blood pressure and actinic keratoses skin lesions on your R forearm and L ear.  Blood pressure -congratulations for losing 6lbs and maintaining an active lifestyle with bike riding on top of your work schedule -your blood pressure was elevated -we will start a new blood pressure medication called hydrochlorothiazide (HCTZ) 12.5mg , which is a diuretic that will likely cause you to urinate more frequently   Skin Lesions -since the last visit, your AK spots appear to have fallen off -we performed cryotherapy on a hypopigmented area of your L ear  Follow up in one month for a blood pressure check after colonoscopy has been done

## 2015-10-06 NOTE — Progress Notes (Deleted)
Subjective:     Patient ID: Jason Chang, male   DOB: 04-May-1965, 50 y.o.   MRN: [id number]  HPI  Jason Chang is a 50 year old male presents to clinic today for follow up of hypertension and actinic keratoses skin lesions.  Hypertension A trial of lifestyle changes to control blood pressure was attempted. The patient has cut back on high carb foods and has been watching salt intake. He has also been bicycling for an hour everyday after work. His weight is down 6lbs since the last visit. He denies any visual changes, dizziness, HA, palpitations.  Skin Lesions On the last visit to clinic, the patient was noted to have AKs on the R forearm (31mm x 34mm scaling rash) and L ear.  We planned for punch biopsy and cryotherapy during this follow-up visit. However, the patient reports that the skin lesions have fallen off on their own since his last visit. He denies any associated pain, ulcerations, bleeding, itching.  Preventative Screening Patient was referred to GI and has a scheduled colonoscopy.  Review of Systems  Constitutional: Negative for fever, appetite change, fatigue and unexpected weight change.  Eyes: Negative.   Respiratory: Negative for shortness of breath.   Cardiovascular: Negative for chest pain, palpitations and leg swelling.  Genitourinary: Negative.   Skin: Negative for pallor and rash.     Objective:   Physical Exam  Constitutional: He appears well-developed and well-nourished. No distress.  Eyes: EOM are normal. Pupils are equal, round, and reactive to light. No scleral icterus.  Cardiovascular: Normal rate and regular rhythm.   No murmur heard. Pulmonary/Chest: Effort normal and breath sounds normal. He has no wheezes. He has no rales.  Skin: Skin is warm. No rash noted. No erythema. No pallor.  BP 152/86 mmHg  Pulse 67  Temp(Src) 98.5 F (36.9 C) (Oral)  Ht 6' (1.829 m)  Wt 236 lb 3.2 oz (107.14 kg)  BMI 32.03 kg/m2   Procedure Note: Cryotherapy Left  Ear Verbal and written consent obtained. Risks and Benefits Discussed. Cryotherapy gun used to completed 3 rounds to freezing to left ear. Patient tolerated well. No complications.      Assessment:     Jason Chang is a 50 year old male presents to clinic today for follow up of hypertension and actinic keratoses skin lesions.    Plan:     Elevated blood pressure  Blood pressure remains elevated despite lifestyle changes -continue lifestyle modifications such as improving diet (decrease salt intake) and exercise/weight loss -start hydrochlorothiazide 12.5mg  -Follow up BP in one month  AK (actinic keratosis)  Actinic keratoses localized to right forearm and pinna of L ear noted on last visit to clinic -lesions no longer present (pt states they "fell off on their own") - mild scale of left ear still present -performed cryotherapy on hypopigmented area of L ear   I personally saw and evaluated the patient. The physical exam was completed by me. History was confirmed. Dossie Arbour MD

## 2015-10-07 NOTE — Assessment & Plan Note (Signed)
Actinic keratoses localized to right forearm and pinna of L ear noted on last visit to clinic -lesions no longer present (pt states they "fell off on their own") - mild scale of left ear still present -performed cryotherapy on hypopigmented area of L ear

## 2015-10-07 NOTE — Progress Notes (Signed)
Subjective:     Patient ID: Trayquan Campeau, male   DOB: 1965-06-15, 50 y.o.   MRN: [id number]  HPI  Mr. Strebeck is a 50 year old male presents to clinic today for follow up of hypertension and actinic keratoses skin lesions.  Hypertension A trial of lifestyle changes to control blood pressure was attempted. The patient has cut back on high carb foods and has been watching salt intake. He has also been bicycling for an hour everyday after work. His weight is down 6lbs since the last visit. He denies any visual changes, dizziness, HA, palpitations.  Skin Lesions On the last visit to clinic, the patient was noted to have AKs on the R forearm (55mm x 44mm scaling rash) and L ear.  We planned for punch biopsy and cryotherapy during this follow-up visit. However, the patient reports that the skin lesions have fallen off on their own since his last visit. He denies any associated pain, ulcerations, bleeding, itching.  Preventative Screening Patient was referred to GI and has a scheduled colonoscopy.  Review of Systems  Constitutional: Negative for fever, appetite change, fatigue and unexpected weight change.  Eyes: Negative.   Respiratory: Negative for shortness of breath.   Cardiovascular: Negative for chest pain, palpitations and leg swelling.  Genitourinary: Negative.   Skin: Negative for pallor and rash.     Objective:   Physical Exam  Constitutional: He appears well-developed and well-nourished. No distress.  Eyes: EOM are normal. Pupils are equal, round, and reactive to light. No scleral icterus.  Cardiovascular: Normal rate and regular rhythm.   No murmur heard. Pulmonary/Chest: Effort normal and breath sounds normal. He has no wheezes. He has no rales.  Skin: Skin is warm. No rash noted. No erythema. No pallor.  BP 152/86 mmHg  Pulse 67  Temp(Src) 98.5 F (36.9 C) (Oral)  Ht 6' (1.829 m)  Wt 236 lb 3.2 oz (107.14 kg)  BMI 32.03 kg/m2   Procedure Note: Cryotherapy Left  Ear Verbal and written consent obtained. Risks and Benefits Discussed. Cryotherapy gun used to completed 3 rounds to freezing to left ear. Patient tolerated well. No complications.      Assessment:     Mr. Sciandra is a 50 year old male presents to clinic today for follow up of hypertension and actinic keratoses skin lesions.    Plan:     Elevated blood pressure  Blood pressure remains elevated despite lifestyle changes -continue lifestyle modifications such as improving diet (decrease salt intake) and exercise/weight loss -start hydrochlorothiazide 12.5mg  -Follow up BP in one month  AK (actinic keratosis)  Actinic keratoses localized to right forearm and pinna of L ear noted on last visit to clinic -lesions no longer present (pt states they "fell off on their own") - mild scale of left ear still present -performed cryotherapy on hypopigmented area of L ear

## 2015-10-07 NOTE — Assessment & Plan Note (Signed)
Blood pressure remains elevated despite lifestyle changes -continue lifestyle modifications such as improving diet (decrease salt intake) and exercise/weight loss -start hydrochlorothiazide 12.5mg  -Follow up BP in one month

## 2015-11-01 ENCOUNTER — Encounter: Payer: Self-pay | Admitting: Gastroenterology

## 2015-11-14 ENCOUNTER — Encounter: Payer: Self-pay | Admitting: Gastroenterology

## 2015-11-14 ENCOUNTER — Encounter: Payer: Self-pay | Admitting: Internal Medicine

## 2015-11-15 ENCOUNTER — Ambulatory Visit (AMBULATORY_SURGERY_CENTER): Payer: BC Managed Care – PPO | Admitting: Gastroenterology

## 2015-11-15 ENCOUNTER — Encounter: Payer: Self-pay | Admitting: Gastroenterology

## 2015-11-15 VITALS — BP 126/79 | HR 61 | Temp 97.5°F | Resp 11 | Ht 72.0 in | Wt 242.0 lb

## 2015-11-15 DIAGNOSIS — Z1211 Encounter for screening for malignant neoplasm of colon: Secondary | ICD-10-CM

## 2015-11-15 MED ORDER — SODIUM CHLORIDE 0.9 % IV SOLN: 500.0000 mL | INTRAVENOUS | Status: DC

## 2015-11-15 NOTE — Patient Instructions (Signed)
YOU HAD AN ENDOSCOPIC PROCEDURE TODAY AT [facility]:   Refer to the procedure report that was given to you for any specific questions about what was found during the examination.  If the procedure report does not answer your questions, please call your gastroenterologist to clarify.  If you requested that your care partner not be given the details of your procedure findings, then the procedure report has been included in a sealed envelope for you to review at your convenience later.  YOU SHOULD EXPECT: Some feelings of bloating in the abdomen. Passage of more gas than usual.  Walking can help get rid of the air that was put into your GI tract during the procedure and reduce the bloating. If you had a lower endoscopy (such as a colonoscopy or flexible sigmoidoscopy) you may notice spotting of blood in your stool or on the toilet paper. If you underwent a bowel prep for your procedure, you may not have a normal bowel movement for a few days.  Please Note:  You might notice some irritation and congestion in your nose or some drainage.  This is from the oxygen used during your procedure.  There is no need for concern and it should clear up in a day or so.  SYMPTOMS TO REPORT IMMEDIATELY:   Following lower endoscopy (colonoscopy or flexible sigmoidoscopy):  Excessive amounts of blood in the stool  Significant tenderness or worsening of abdominal pains  Swelling of the abdomen that is new, acute  Fever of 100F or higher   DIET:  We do recommend a small meal at first, but then you may proceed to your regular diet.  Drink plenty of fluids but you should avoid alcoholic beverages for 24 hours.  ACTIVITY:  You should plan to take it easy for the rest of today and you should NOT DRIVE or use heavy machinery until tomorrow (because of the sedation medicines used during the test).    FOLLOW UP: Our staff will call the number listed on your records the next business day following your  procedure to check on you and address any questions or concerns that you may have regarding the information given to you following your procedure. If we do not reach you, we will leave a message.  However, if you are feeling well and you are not experiencing any problems, there is no need to return our call.  We will assume that you have returned to your regular daily activities without incident.  If any biopsies were taken you will be contacted by phone or by letter within the next 1-3 weeks.  Please call us at [phone] if you have not heard about the biopsies in 3 weeks.    SIGNATURES/CONFIDENTIALITY: You and/or your care partner have signed paperwork which will be entered into your electronic medical record.  These signatures attest to the fact that that the information above on your After Visit Summary has been reviewed and is understood.  Full responsibility of the confidentiality of this discharge information lies with you and/or your care-partner.   Thank you for letting us taking care of your healthcare needs today.

## 2015-11-15 NOTE — Op Note (Signed)
[place] Patient Name: Jason Chang Procedure Date: 11/15/2015 9:40 AM MRN: [id number] Endoscopist: Ladene Artist , MD Age: 50 Referring MD:  Date of Birth: September 18, 1965 Gender: Male Account #: [id number] Procedure:                Colonoscopy Indications:              Screening for colorectal malignant neoplasm, This                            is the patient's first colonoscopy Medicines:                Monitored Anesthesia Care Procedure:                Pre-Anesthesia Assessment:                           - Prior to the procedure, a History and Physical                            was performed, and patient medications and                            allergies were reviewed. The patient's tolerance of                            previous anesthesia was also reviewed. The risks                            and benefits of the procedure and the sedation                            options and risks were discussed with the patient.                            All questions were answered, and informed consent                            was obtained. Prior Anticoagulants: The patient has                            taken no previous anticoagulant or antiplatelet                            agents. ASA Grade Assessment: II - A patient with                            mild systemic disease. After reviewing the risks                            and benefits, the patient was deemed in                            satisfactory condition to undergo the procedure.  After obtaining informed consent, the colonoscope                            was passed under direct vision. Throughout the                            procedure, the patient's blood pressure, pulse, and                            oxygen saturations were monitored continuously. The                            Model PCF-H190L [id number]) scope was introduced                            through the anus and  advanced to the the cecum,                            identified by appendiceal orifice and ileocecal                            valve. The ileocecal valve, appendiceal orifice,                            and rectum were photographed. The quality of the                            bowel preparation was good. The colonoscopy was                            performed without difficulty. The patient tolerated                            the procedure well. Scope In: 9:52:34 AM Scope Out: 10:03:16 AM Scope Withdrawal Time: 0 hours 9 minutes 21 seconds  Total Procedure Duration: 0 hours 10 minutes 42 seconds  Findings:                 The entire examined colon appeared normal on direct                            and retroflexion views. Complications:            No immediate complications. Estimated blood loss:                            None. Estimated Blood Loss:     Estimated blood loss: none. Impression:               - The entire examined colon is normal on direct and                            retroflexion views.                           - No specimens collected. Recommendation:           -  Repeat colonoscopy in 10 years for screening                            purposes.                           - Patient has a contact number available for                            emergencies. The signs and symptoms of potential                            delayed complications were discussed with the                            patient. Return to normal activities tomorrow.                            Written discharge instructions were provided to the                            patient.                           - Resume previous diet.                           - Continue present medications. Ladene Artist, MD 11/15/2015 10:06:32 AM This report has been signed electronically.

## 2015-11-15 NOTE — Progress Notes (Signed)
Patient awakening,vss,report to rn 

## 2015-11-16 ENCOUNTER — Telehealth: Payer: Self-pay

## 2015-11-16 NOTE — Telephone Encounter (Signed)
  Follow up Call-  Call back number 11/15/2015  Post procedure Call Back phone  # [phone] cell  Permission to leave phone message Yes     Patient questions:  Do you have a fever, pain , or abdominal swelling? No. Pain Score  0 *  Have you tolerated food without any problems? Yes.    Have you been able to return to your normal activities? Yes.    Do you have any questions about your discharge instructions: Diet   No. Medications  No. Follow up visit  No.  Do you have questions or concerns about your Care? No.  Actions: * If pain score is 4 or above: No action needed, pain <4.

## 2015-11-28 NOTE — Progress Notes (Signed)
   Subjective:    Patient ID: Jason Chang, male    DOB: 1965/05/13, 50 y.o.   MRN: [id number]  HPI 50 y/o male presents for follow up of hypertension.  HTN Started on HCTZ 12.5 mg at last visit. Has not been taking HCTZ. Riding bike 5-6 days per week (hour at a time), no chest pain, no vision changes (wears glasses), Home BP 130's-140's/80's  Skin Lesion New skin lesion right arm, present for a few weeks, no itching   Review of Systems No fevers, chills No chest pain No sob.     Objective:   Physical Exam BP (!) 152/94   Pulse 72   Temp 97.8 F (36.6 C) (Oral)   Ht 6' (1.829 m)   Wt 228 lb 3.2 oz (103.5 kg)   BMI 30.95 kg/m  Down 14 pounds from last visit 2 weeks ago.  Gen: pleasant male, NAD Cardiac: RRR, S1 and S2 present, no murmur Resp: CTAB, normal effort Ext: no edema  Procedure Note: Cryotherapy Risks and Benefits discussed. Verbal consent obtained. Three rounds of freeze/thaw performed. Tolerated well. No complications.       Assessment & Plan:  Elevated blood pressure Elevated today (likely white coat). Controlled per home readings. Has not been taking HCTZ -hold HCTZ -patient to call in BP values in two weeks -reevaluate at that time  AK (actinic keratosis) AK right forearm -cryotherapy performed  Preventative health care Flu shot provided

## 2015-12-01 ENCOUNTER — Encounter: Payer: Self-pay | Admitting: Family Medicine

## 2015-12-01 ENCOUNTER — Ambulatory Visit (INDEPENDENT_AMBULATORY_CARE_PROVIDER_SITE_OTHER): Payer: BC Managed Care – PPO | Admitting: Family Medicine

## 2015-12-01 DIAGNOSIS — IMO0001 Reserved for inherently not codable concepts without codable children: Secondary | ICD-10-CM

## 2015-12-01 DIAGNOSIS — L570 Actinic keratosis: Secondary | ICD-10-CM | POA: Diagnosis not present

## 2015-12-01 DIAGNOSIS — R030 Elevated blood-pressure reading, without diagnosis of hypertension: Secondary | ICD-10-CM | POA: Diagnosis not present

## 2015-12-01 DIAGNOSIS — Z0000 Encounter for general adult medical examination without abnormal findings: Secondary | ICD-10-CM

## 2015-12-01 DIAGNOSIS — Z23 Encounter for immunization: Secondary | ICD-10-CM | POA: Diagnosis not present

## 2015-12-01 NOTE — Patient Instructions (Signed)
It was nice to see you today.  Please continue to check you blood pressure daily. Call Dr. Ree Kida with your results in two weeks. Take your blood pressure at different times of the day (morning, afternoon, and night).

## 2015-12-01 NOTE — Assessment & Plan Note (Signed)
AK right forearm -cryotherapy performed

## 2015-12-01 NOTE — Assessment & Plan Note (Signed)
Elevated today (likely white coat). Controlled per home readings. Has not been taking HCTZ -hold HCTZ -patient to call in BP values in two weeks -reevaluate at that time

## 2015-12-01 NOTE — Assessment & Plan Note (Signed)
Flu shot provided. 

## 2015-12-19 ENCOUNTER — Telehealth: Payer: Self-pay | Admitting: Family Medicine

## 2015-12-19 DIAGNOSIS — R030 Elevated blood-pressure reading, without diagnosis of hypertension: Secondary | ICD-10-CM

## 2015-12-19 NOTE — Telephone Encounter (Signed)
Pt has resported the following BP:  138/90   Pulse 64 133/85              67 137/80              72 132/82              87 135/81              75 These were taken during the last week. Please advise

## 2015-12-20 NOTE — Telephone Encounter (Signed)
Pt informed. Deseree Blount, CMA  

## 2015-12-20 NOTE — Telephone Encounter (Signed)
Reviewed patient reported blood pressures.   RN staff - please call patient and inform him to continue to check BP regularly (at least 3-4 times per week). No need to take HCTZ. Will check at follow up visits.

## 2015-12-20 NOTE — Assessment & Plan Note (Signed)
Patient called in home BP's that were mostly 130's/70's. Will continue to hold HCTZ. Monitor at subsequent visits.

## 2016-10-19 ENCOUNTER — Ambulatory Visit (INDEPENDENT_AMBULATORY_CARE_PROVIDER_SITE_OTHER): Payer: BC Managed Care – PPO

## 2016-10-19 ENCOUNTER — Ambulatory Visit (INDEPENDENT_AMBULATORY_CARE_PROVIDER_SITE_OTHER): Payer: BC Managed Care – PPO | Admitting: Family Medicine

## 2016-10-19 ENCOUNTER — Encounter: Payer: Self-pay | Admitting: Family Medicine

## 2016-10-19 VITALS — BP 150/90 | HR 66 | Temp 97.8°F | Ht 72.0 in | Wt 242.4 lb

## 2016-10-19 DIAGNOSIS — I10 Essential (primary) hypertension: Secondary | ICD-10-CM | POA: Diagnosis not present

## 2016-10-19 DIAGNOSIS — H8110 Benign paroxysmal vertigo, unspecified ear: Secondary | ICD-10-CM | POA: Diagnosis not present

## 2016-10-19 NOTE — Progress Notes (Signed)
Subjective:    Jason Chang is a 51 y.o. male who presents to [facility] today for dizziness:  1.  Dizziness:  Started today around 5 AM.  Looked at clock and felt immediate "room spinning" sensation.  Got up to get some water, vomited water about 5 minutes later.  Fell asleep around 6 AM, slept for about 30 minutes.  Got up and had wife bring him in to be evaluated.  Felt better as morning progressed.  No further vertiginous symptoms now.  No further vomiting.  He has had no other dizziness or vertigo symptoms in past.  No headaches.  No falls.  No lightheadedness.  States he feels "great" now.    ROS as above per HPI.    The following portions of the patient's history were reviewed and updated as appropriate: allergies, current medications, past medical history, family and social history, and problem list. Patient is a nonsmoker.    PMH reviewed.  Past Medical History:  Diagnosis Date  . Arthritis   . Diabetes mellitus without complication ([place]) 11/1476   diet controlled  . Hyperlipidemia   . Hypertension   . Meniscus tear 02/2013   left   Past Surgical History:  Procedure Laterality Date  . KNEE ARTHROSCOPY Left   . KNEE ARTHROSCOPY Left 03/10/2013   Procedure: LEFT KNEE ARTHROSCOPY WITH PARTIAL MEDIAL MENISCECTOMY;  Surgeon: Mcarthur Rossetti, MD;  Location: [facility];  Service: Orthopedics;  Laterality: Left;    Medications reviewed. No current outpatient prescriptions on file.   Current Facility-Administered Medications  Medication Dose Route Frequency Provider Last Rate Last Dose  . 0.9 %  sodium chloride infusion  500 mL Intravenous Continuous Ladene Artist, MD         Objective:   Physical Exam BP (!) 150/90   Pulse 66   Temp 97.8 F (36.6 C) (Oral)   Ht 6' (1.829 m)   Wt 242 lb 6.4 oz (110 kg)   SpO2 97%   BMI 32.88 kg/m  Gen:  Alert, cooperative patient who appears stated age in no acute distress.  Vital signs reviewed. HEENT: EOMI, PERRL. No  nystagmus.  MMM Neck:  Supple without any stiffness.  No reproduction of symptoms with turning of his head.   Cardiac:  Regular rate and rhythm without murmur auscultated.  Good S1/S2. Pulm:  Clear to auscultation bilaterally with good air movement.  No wheezes or rales noted.   Abd:  Soft/nondistended/nontender.   Exts: Non edematous BL  LE, warm and well perfused. Neuro:  Alert and oriented to person, place, and date.  CN II-XII intact.  Sensation and strength are 5/5 BL upper and lower extremities.  Romberg negative.  Tandem gait completely normal and easily completed.

## 2016-10-19 NOTE — Patient Instructions (Signed)
It was good to meet you today.  You do have a type of vertigo, called benign positional paroxysmal vertigo. This just means that it is affected by your position and it comes and goes.   If it is really bothering if he can take an over-the-counter medicine called meclizine which is a seasickness medicine that can help with the symptoms. Generally just lying still will help make the symptoms go away.  If it comes back and you are continually vomiting and you are not getting any better we need to see you again. I do not think this will happen.  Come back to meet Dr. Ardelia Mems sometime in the next 1-2 months. We are checking blood work today. We will let you know the results.   Benign Positional Vertigo Vertigo is the feeling that you or your surroundings are moving when they are not. Benign positional vertigo is the most common form of vertigo. The cause of this condition is not serious (is benign). This condition is triggered by certain movements and positions (is positional). This condition can be dangerous if it occurs while you are doing something that could endanger you or others, such as driving. What are the causes? In many cases, the cause of this condition is not known. It may be caused by a disturbance in an area of the inner ear that helps your brain to sense movement and balance. This disturbance can be caused by a viral infection (labyrinthitis), head injury, or repetitive motion. What increases the risk? This condition is more likely to develop in:  Women.  People who are 46 years of age or older.  What are the signs or symptoms? Symptoms of this condition usually happen when you move your head or your eyes in different directions. Symptoms may start suddenly, and they usually last for less than a minute. Symptoms may include:  Loss of balance and falling.  Feeling like you are spinning or moving.  Feeling like your surroundings are spinning or moving.  Nausea and  vomiting.  Blurred vision.  Dizziness.  Involuntary eye movement (nystagmus).   Symptoms can be mild and cause only slight annoyance, or they can be severe and interfere with daily life. Episodes of benign positional vertigo may return (recur) over time, and they may be triggered by certain movements. Symptoms may improve over time.  How is this treated? Usually, your health care provider will treat this by moving your head in specific positions to adjust your inner ear back to normal. Surgery may be needed in severe cases, but this is rare. In some cases, benign positional vertigo may resolve on its own in 2-4 weeks.

## 2016-10-19 NOTE — Assessment & Plan Note (Signed)
Symptoms have completely resolved now.   Nothing on exam today.  Neurological exam completely normal.   Not likely labyrinthitis due to quick resolution. Central lesion highly unlikely.   No lightheaded/orthostatic symptoms.   Patient is going out of town on vacation tomorrow and just wanted to be checked before leaving.  Warning precautions provided as well as information on BPPV. Meclizine if symptoms return.

## 2016-10-20 LAB — COMPREHENSIVE METABOLIC PANEL
ALK PHOS: 48 IU/L (ref 39–117)
ALT: 19 IU/L (ref 0–44)
AST: 21 IU/L (ref 0–40)
Albumin/Globulin Ratio: 2.1 (ref 1.2–2.2)
Albumin: 4.7 g/dL (ref 3.5–5.5)
BILIRUBIN TOTAL: 0.6 mg/dL (ref 0.0–1.2)
BUN/Creatinine Ratio: 12 (ref 9–20)
BUN: 12 mg/dL (ref 6–24)
CHLORIDE: 100 mmol/L (ref 96–106)
CO2: 27 mmol/L (ref 20–29)
CREATININE: 1 mg/dL (ref 0.76–1.27)
Calcium: 9.7 mg/dL (ref 8.7–10.2)
GFR calc Af Amer: 99 mL/min/{1.73_m2} (ref >59)
GFR calc non Af Amer: 86 mL/min/{1.73_m2} (ref >59)
GLOBULIN, TOTAL: 2.2 g/dL (ref 1.5–4.5)
GLUCOSE: 91 mg/dL (ref 65–99)
Potassium: 4.7 mmol/L (ref 3.5–5.2)
SODIUM: 140 mmol/L (ref 134–144)
Total Protein: 6.9 g/dL (ref 6.0–8.5)

## 2016-10-20 LAB — CBC
Hematocrit: 47.6 % (ref 37.5–51.0)
Hemoglobin: 16.4 g/dL (ref 13.0–17.7)
MCH: 30.7 pg (ref 26.6–33.0)
MCHC: 34.5 g/dL (ref 31.5–35.7)
MCV: 89 fL (ref 79–97)
PLATELETS: 165 10*3/uL (ref 150–379)
RBC: 5.35 x10E6/uL (ref 4.14–5.80)
RDW: 13.9 % (ref 12.3–15.4)
WBC: 5.3 10*3/uL (ref 3.4–10.8)

## 2016-10-20 LAB — LIPID PANEL
CHOL/HDL RATIO: 4.6 ratio (ref 0.0–5.0)
CHOLESTEROL TOTAL: 179 mg/dL (ref 100–199)
HDL: 39 mg/dL — AB (ref >39)
LDL Calculated: 119 mg/dL — ABNORMAL HIGH (ref 0–99)
TRIGLYCERIDES: 103 mg/dL (ref 0–149)
VLDL Cholesterol Cal: 21 mg/dL (ref 5–40)

## 2016-10-20 LAB — TSH: TSH: 1.6 u[IU]/mL (ref 0.450–4.500)

## 2016-10-23 ENCOUNTER — Encounter: Payer: Self-pay | Admitting: Family Medicine

## 2016-12-07 ENCOUNTER — Ambulatory Visit (INDEPENDENT_AMBULATORY_CARE_PROVIDER_SITE_OTHER): Payer: BC Managed Care – PPO | Admitting: Family Medicine

## 2016-12-07 ENCOUNTER — Encounter: Payer: Self-pay | Admitting: Family Medicine

## 2016-12-07 VITALS — BP 140/80 | HR 67 | Temp 98.4°F | Ht 72.0 in | Wt 238.4 lb

## 2016-12-07 DIAGNOSIS — Z8639 Personal history of other endocrine, nutritional and metabolic disease: Secondary | ICD-10-CM | POA: Diagnosis not present

## 2016-12-07 DIAGNOSIS — Z23 Encounter for immunization: Secondary | ICD-10-CM

## 2016-12-07 DIAGNOSIS — H578 Other specified disorders of eye and adnexa: Secondary | ICD-10-CM

## 2016-12-07 DIAGNOSIS — H5789 Other specified disorders of eye and adnexa: Secondary | ICD-10-CM | POA: Insufficient documentation

## 2016-12-07 DIAGNOSIS — R030 Elevated blood-pressure reading, without diagnosis of hypertension: Secondary | ICD-10-CM

## 2016-12-07 LAB — POCT GLYCOSYLATED HEMOGLOBIN (HGB A1C): HEMOGLOBIN A1C: 6.1

## 2016-12-07 NOTE — Assessment & Plan Note (Signed)
The patient lacks concerning signs and symptoms such as discharge from the eye, corneal injection, acute loss or fogging of vision, severe pain and headaches. The relation of the eye burning to increased time on a computer further suggests a benign cause for this symptom. Visual acuity testing and physical exam did not reveal concerning findings. -encourage patient to follow up with his eye doctor for check up and to ensure that his glasses remain the correct prescription

## 2016-12-07 NOTE — Assessment & Plan Note (Signed)
The patient's blood pressure was 140/80 at today's clinic visit. This provides evidence of reasonable control, especially considering the patient's reported history of white-coat hypertension. He is currently managed without medications and has no indication to begin medical management at this time. -continue to encourage exercise and healthy diet to control blood pressure

## 2016-12-07 NOTE — Assessment & Plan Note (Addendum)
Patient states that his blood glucose was 95 this morning and it was 91 when last measured in the clinic. Given these numbers and his last reported A1C of 5.9 it is unlikely that he is progressing to diabetes. Odd story given his report that he had A1c of 10.4 previously. However, continued monitoring to ensure proper counseling and treatment will be performed. -check HbA1C in clinic today

## 2016-12-07 NOTE — Progress Notes (Addendum)
Date of Visit: 12/07/2016   HPI:  Patient was recently reassigned to me after his prior PCP Dr. Ree Kida left our practice. This is my first time meeting him.  Eyes burning: The patient states that his eyes have been burning consistenlty over the last month. He states that this happens on a daily basis and that his eyes were burning during the clinic visit this morning. Recently he has spent more time on a computer for work and he states that looking at a computer screen exacerbates the burning sensation. The patient endorses some blurring of vision over the last month, but denies focal loss of vision, foggy vision or vision going black. He also denies discharge from his eyes, redness of the eyes, headaches, runny nose. Last eye exam was >1 year ago. Wears glasses and thinks he needs a new rx.   Pre-Diabetes: The patient has been monitoring his diet but states that he recently "fell off the wagon" and has gained 15 lbs recently. He has stopped drinking soda and sweat tea and states that this has help keep his blood sugars low. H checked a blood glucose this morning before coming to clinic and reports that it was 95. Apparently he has a history of actual diabetes, reports at one time his A1c was 10.4. He has not been on diabetes medications in some time but used to take metformin.  Benign Paroxysmal Vertigo: Since having an episode a month ago, the patient has had no recurrence of dizziness or room spinning and has no complaints related to this issue.   ROS: See HPI.  [place]:  FH: does have a family history of diabetes   PHYSICAL EXAM: BP 140/80   Pulse 67   Temp 98.4 F (36.9 C) (Oral)   Ht 6' (1.829 m)   Wt 238 lb 6.4 oz (108.1 kg)   SpO2 97%   BMI 32.33 kg/m  Gen: Well appearing, pleasant, no acute distress  HEENT: PERRL, conjunctiva clear, no discharge evident from eyes, EOMI Heart: RRR, nMRG, nL S1, S2 Lungs: CTAB, no increased work of breathing  Neuro: speech normal, grossly  intact  Visual acuity: L 20/25  R 20/25  B 20/20   ASSESSMENT/PLAN:  Health maintenance:  -Flu shot given today   Hx of diabetes mellitus Patient states that his blood glucose was 95 this morning and it was 91 when last measured in the clinic. Given these numbers and his last reported A1C of 5.9 it is unlikely that he is progressing to diabetes. However, continued monitoring to ensure proper counseling and treatment will be performed. -check HbA1C in clinic today     Eye irritation The patient lacks concerning signs and symptoms such as discharge from the eye, corneal injection, acute loss or fogging of vision, severe pain and headaches. The relation of the eye burning to increased time on a computer further suggests a benign cause for this symptom. Visual acuity testing and physical exam did not reveal concerning findings. -encourage patient to follow up with his eye doctor for check up and to ensure that his glasses remain the correct prescription   Prehypertension The patient's blood pressure was 140/80 at today's clinic visit. This provides evidence of reasonable control, especially considering the patient's reported history of white-coat hypertension. He is currently managed without medications and has no indication to begin medical management at this time. -continue to encourage exercise and healthy diet to control blood pressure   FOLLOW UP: Follow up in one year for Routine exam  Patient seen along with [employer] student Rhetta Mura. I personally evaluated this patient along with the student, and verified all aspects of the history, physical exam, and medical decision making as documented by the student. I agree with the student's documentation and have made all necessary edits.   [name], MD

## 2016-12-07 NOTE — Patient Instructions (Signed)
It was nice to meet you today.  You should schedule an appointment with your eye doctor for an eye exam.   We checked an A1C today to look at your blood sugar.   We will see you in a year for a follow up appointment, but please call us if you need anything before then.

## 2016-12-10 ENCOUNTER — Encounter: Payer: Self-pay | Admitting: Family Medicine

## 2017-08-07 ENCOUNTER — Encounter: Payer: Self-pay | Admitting: Family Medicine

## 2017-08-07 ENCOUNTER — Ambulatory Visit: Payer: BC Managed Care – PPO | Admitting: Family Medicine

## 2017-08-07 ENCOUNTER — Other Ambulatory Visit: Payer: Self-pay

## 2017-08-07 VITALS — BP 158/92 | HR 74 | Temp 97.9°F | Ht 72.0 in | Wt 239.4 lb

## 2017-08-07 DIAGNOSIS — M549 Dorsalgia, unspecified: Secondary | ICD-10-CM | POA: Insufficient documentation

## 2017-08-07 DIAGNOSIS — M545 Low back pain, unspecified: Secondary | ICD-10-CM

## 2017-08-07 LAB — POCT URINALYSIS DIP (MANUAL ENTRY)
Bilirubin, UA: NEGATIVE
Blood, UA: NEGATIVE
Glucose, UA: NEGATIVE mg/dL
Ketones, POC UA: NEGATIVE mg/dL
LEUKOCYTES UA: NEGATIVE
Nitrite, UA: NEGATIVE
Protein Ur, POC: NEGATIVE mg/dL
Spec Grav, UA: >=1.03 — AB (ref 1.010–1.025)
UROBILINOGEN UA: 1 U/dL
pH, UA: 6 (ref 5.0–8.0)

## 2017-08-07 NOTE — Patient Instructions (Addendum)
It was good to see you today.  Thank you for coming in.  I think you have low back strain for over use.      You should be better in 5-7 days  If you are not better by then or if you have weakness or fever or pain with urination then come back to see [place]  Things to do to help you feel better Continue Aleve twice a day.  Can tylenol with the Aleve  Heat as needed  Bend knees to pickup things   Be Well

## 2017-08-07 NOTE — Assessment & Plan Note (Signed)
Seems consistent with overuse from rototiller.  No signs of pyelo or disk disease.  Continue aleve and heat

## 2017-08-07 NOTE — Progress Notes (Signed)
Subjective  Jason Chang is a 52 y.o. male is presenting with the following  BACK PAIN  Back pain began 2 days ago. Pain is described as achy bilateral lower back. Patient has tried aleve which helps. Pain radiates no. History of trauma or injury: Used rototiller a lot day before pain started Patient believes might be causing their pain: kidneys?  Prior history of similar pain: no History of cancer: no Weak immune system:  no History of IV drug use: no History of steroid use: no  Symptoms Incontinence of bowel or bladder:  no Numbness of leg: no Fever: no Rest or Night pain: no Weight Loss:  no Rash: no   ROS see HPI Smoking Status noted.   Chief Complaint noted Review of Symptoms - see HPI PMH - Smoking status noted.    Objective Vital Signs reviewed BP (!) 158/92   Pulse 74   Temp 97.9 F (36.6 C) (Oral)   Ht 6' (1.829 m)   Wt 239 lb 6.4 oz (108.6 kg)   SpO2 98%   BMI 32.47 kg/m  Alert NAD Neurologic exam : Cn 2-7 intact Strength equal & normal in upper & lower extremities Able to walk on heels and toes.   Balance normal    Back - Normal skin, Spine with normal alignment and no deformity.  No tenderness to vertebral process palpation.  Mild bilateral Paraspinous muscles tenderness without spasm.   Range of motion is full at neck and lumbar sacral regions.  No CVAT  Assessments/Plans  See after visit summary for details of patient instuctions  Back pain Seems consistent with overuse from rototiller.  No signs of pyelo or disk disease.  Continue aleve and heat

## 2018-01-16 ENCOUNTER — Ambulatory Visit: Payer: BC Managed Care – PPO

## 2018-01-30 ENCOUNTER — Telehealth: Payer: Self-pay

## 2018-01-30 NOTE — Telephone Encounter (Signed)
Called patient's daughter Izora Gala and informed her that RX is at pharmacy.  Ozella Almond, [place]

## 2019-02-10 ENCOUNTER — Other Ambulatory Visit: Payer: Self-pay

## 2019-02-10 ENCOUNTER — Encounter: Payer: Self-pay | Admitting: Family Medicine

## 2019-02-10 ENCOUNTER — Ambulatory Visit (INDEPENDENT_AMBULATORY_CARE_PROVIDER_SITE_OTHER): Payer: BC Managed Care – PPO | Admitting: Family Medicine

## 2019-02-10 VITALS — BP 142/74 | HR 85 | Ht 72.0 in | Wt 238.0 lb

## 2019-02-10 DIAGNOSIS — M79661 Pain in right lower leg: Secondary | ICD-10-CM | POA: Insufficient documentation

## 2019-02-10 DIAGNOSIS — Z23 Encounter for immunization: Secondary | ICD-10-CM

## 2019-02-10 NOTE — Progress Notes (Signed)
    Subjective:  Jason Chang is a 53 y.o. male who presents to the [facility] today with a chief complaint of right leg calf pain.   HPI:  This weekend patient states that he was out working in his yard picking up leaves later.  Said he walked over 2000 steps a day.  Later that day he said he developed right calf "soreness "and "burning ".  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  Patient wears a compression sleeve over left leg.  He has follow-up with vascular in a few weeks for an ablative therapy for this.  He is coming in because he is concerned that he may have developed another blood clot.  Patient reports that the pain in his right leg is gotten better over the past 2 days.  Patient denies any pain right now.  Denies any chest pain or shortness of breath or calf swelling.  ROS: Per HPI   Objective:  Physical Exam: BP (!) 142/74   Pulse 85   Ht 6' (1.829 m)   Wt 238 lb (108 kg)   SpO2 97%   BMI 32.28 kg/m   Gen: NAD, resting comfortably CV: RRR with no murmurs appreciated Pulm: NWOB, CTAB with no crackles, wheezes, or rhonchi MSK: Bilateral calfs are symmetric diameter, there is no edema or erythema, there is no tenderness in the right calf, 5 of 5 strength bilaterally in lower extremities throughout Skin: warm, dry Neuro: grossly normal, moves all extremities Psych: Normal affect and thought content  No results found for this or any previous visit (from the past 72 hour(s)).   Assessment/Plan:  Right calf pain Right calf pain that is now resolved.  Although he has a history of left leg clotting, this was after a traumatic event.  There are no physical exam findings of DVT.  No symptoms of PE.  Well score 1.  Appears to be MSK.  Gave patient strict return precautions especially if he develops any chest pain, shortness of breath needs to go to the emergency room.  Patient is aware and acknowledges.  Precepted with Dr.  McDiarmid.   Lab Orders  No laboratory test(s) ordered today    No orders of the defined types were placed in this encounter.     Marny Lowenstein, MD, MS FAMILY MEDICINE RESIDENT - PGY3 02/10/2019 3:05 PM

## 2019-02-10 NOTE — Assessment & Plan Note (Signed)
Right calf pain that is now resolved.  Although he has a history of left leg clotting, this was after a traumatic event.  There are no physical exam findings of DVT.  No symptoms of PE.  Well score 1.  Appears to be MSK.  Gave patient strict return precautions especially if he develops any chest pain, shortness of breath needs to go to the emergency room.  Patient is aware and acknowledges.  Precepted with Dr. McDiarmid.

## 2019-02-10 NOTE — Patient Instructions (Signed)
It was a pleasure to see you today! Thank you for choosing [facility] for your primary care. Jason Chang was seen for right calf pain.    Given that to have had resolution of symptoms, this is most likely musculoskeletal pain.  Please come back to clinic or go to the emergency room if you have return of symptoms, develop any chest pain, shortness of breath, or any other worrisome symptom.   Best,  Marny Lowenstein, MD, MS FAMILY MEDICINE RESIDENT - PGY3 02/10/2019 2:57 PM

## 2019-04-16 DIAGNOSIS — L570 Actinic keratosis: Secondary | ICD-10-CM | POA: Diagnosis not present

## 2019-04-16 DIAGNOSIS — L814 Other melanin hyperpigmentation: Secondary | ICD-10-CM | POA: Diagnosis not present

## 2019-04-16 DIAGNOSIS — L905 Scar conditions and fibrosis of skin: Secondary | ICD-10-CM | POA: Diagnosis not present

## 2019-04-16 DIAGNOSIS — L819 Disorder of pigmentation, unspecified: Secondary | ICD-10-CM | POA: Diagnosis not present

## 2019-04-16 DIAGNOSIS — D1801 Hemangioma of skin and subcutaneous tissue: Secondary | ICD-10-CM | POA: Diagnosis not present

## 2019-05-18 DIAGNOSIS — L570 Actinic keratosis: Secondary | ICD-10-CM | POA: Diagnosis not present

## 2019-05-18 DIAGNOSIS — Z85828 Personal history of other malignant neoplasm of skin: Secondary | ICD-10-CM | POA: Diagnosis not present

## 2019-05-18 DIAGNOSIS — L905 Scar conditions and fibrosis of skin: Secondary | ICD-10-CM | POA: Diagnosis not present

## 2019-05-18 DIAGNOSIS — L819 Disorder of pigmentation, unspecified: Secondary | ICD-10-CM | POA: Diagnosis not present

## 2019-06-22 ENCOUNTER — Encounter: Payer: Self-pay | Admitting: Family Medicine

## 2019-06-22 LAB — BASIC METABOLIC PANEL
CO2: 25 — AB (ref 13–22)
Chloride: 99 (ref 99–108)
Creatinine: 1 (ref 0.6–1.3)
Potassium: 4.5 (ref 3.4–5.3)
Sodium: 224 — AB (ref 137–147)

## 2019-06-22 LAB — LIPID PANEL
Cholesterol: 218 — AB (ref 0–200)
HDL: 40 (ref 35–70)
LDL Cholesterol: 142
Triglycerides: 218 — AB (ref 40–160)

## 2019-06-22 LAB — HEPATIC FUNCTION PANEL
ALT: 18 (ref 10–40)
AST: 23 (ref 14–40)
Alkaline Phosphatase: 51 (ref 25–125)
Bilirubin, Total: 0.7

## 2019-06-22 LAB — CBC AND DIFFERENTIAL
HCT: 50 (ref 41–53)
Hemoglobin: 16.7 (ref 13.5–17.5)
Platelets: 166 (ref 150–399)
WBC: 4.8

## 2019-06-22 LAB — TSH: TSH: 1.39 (ref 0.41–5.90)

## 2019-07-09 ENCOUNTER — Ambulatory Visit (INDEPENDENT_AMBULATORY_CARE_PROVIDER_SITE_OTHER): Payer: BLUE CROSS/BLUE SHIELD | Admitting: Family Medicine

## 2019-07-09 ENCOUNTER — Other Ambulatory Visit: Payer: Self-pay

## 2019-07-09 VITALS — BP 140/70 | HR 72 | Ht 72.0 in | Wt 227.6 lb

## 2019-07-09 DIAGNOSIS — H5789 Other specified disorders of eye and adnexa: Secondary | ICD-10-CM

## 2019-07-09 DIAGNOSIS — E119 Type 2 diabetes mellitus without complications: Secondary | ICD-10-CM | POA: Diagnosis not present

## 2019-07-09 DIAGNOSIS — Z8639 Personal history of other endocrine, nutritional and metabolic disease: Secondary | ICD-10-CM | POA: Diagnosis not present

## 2019-07-09 LAB — POCT GLYCOSYLATED HEMOGLOBIN (HGB A1C): HbA1c, POC (controlled diabetic range): 7.5 % — AB (ref 0.0–7.0)

## 2019-07-09 MED ORDER — METFORMIN HCL ER 500 MG PO TB24: 500.0000 mg | ORAL_TABLET | Freq: Every day | ORAL | 1 refills | Status: DC

## 2019-07-09 MED ORDER — ATORVASTATIN CALCIUM 40 MG PO TABS: 40.0000 mg | ORAL_TABLET | Freq: Every day | ORAL | 3 refills | Status: DC

## 2019-07-09 MED ORDER — ASPIRIN EC 81 MG PO TBEC: 81.0000 mg | DELAYED_RELEASE_TABLET | Freq: Every day | ORAL | 3 refills | Status: AC

## 2019-07-09 MED ORDER — OLOPATADINE HCL 0.2 % OP SOLN: OPHTHALMIC | 3 refills | Status: DC

## 2019-07-09 NOTE — Progress Notes (Signed)
SUBJECTIVE:   CHIEF COMPLAINT / HPI:   Pre-diabetes: Recent job physical 3 weeks ago, where his blood glucose was 224 but he was not fasting at that time. He states that he had "fallen off the wagon" before that lab work, but since then he has watched his diet. Morning sugars have ranged from 95 to 144 over the last 2 weeks since changing his diet. Stopped the fried food, bread. Eating more salads, fruits, veggies. Drinks water and unsweetened tea. Was on metformin in the past for 7 days which brought his sugars into the 90s and his reading stayed below 100 once he stopped the metformin. He denies any loss of sensation, tingling or burning of his legs or feet.  Eyes burning: He states that his eyes burn "on and off" randomly on a daily basis. He thinks that its getting worse now with the pollen but the burning happens year round. He states that over the past 2 years his job has increased the amount of computer work and that staring into the computer screen increases the burning. Additionally he says the burning is worse in the morning. He has not seen the eye doctor in years and is unsure if his glasses prescription is correct. He believes that wearing glasses decreases the burning. He denies any eye itchiness, changes is vision, foggy vision, halos, or vision loss. No accompanying symptoms of runny/itchy nose, cough, sore throat.   Covid: Had covid in February of 2021 with mild symptoms and was wondering if he needed to wait a certain amount of time before getting the vaccine.  PERTINENT  PMH / PSH: Hx of diabetes mellitus, eye irritation, BPPV  OBJECTIVE:   BP 140/70   Pulse 72   Ht 6' (1.829 m)   Wt 227 lb 9.6 oz (103.2 kg)   SpO2 97%   BMI 30.87 kg/m   Physical Exam  Constitutional: He is well-developed, well-nourished, and in no distress. No distress.  Eyes: Pupils are equal, round, and reactive to light. Right eye exhibits no discharge. Left eye exhibits no discharge.  Slight  erythema of the sclera but no perilimbal redness Cardiovascular: Normal rate, regular rhythm, normal heart sounds and intact distal pulses.  Pulmonary/Chest: Effort normal and breath sounds normal.  Neurological:  Diabetic foot exam: mildly decreased sensation of monofilament in mid-ball of L foot / 2+ DP and posterior tibial pulses bilaterally  ASSESSMENT/PLAN:   Health maintenance: -COVID vaccine - recommended that he should go ahead and get this -Tdap and Pneumovax - advised to schedule RN visit for these at least 2 weeks after his second COVID vaccine dose -advised to schedule eye exam  Labs from his job entered into historical abstract, will also scan in  Type 2 diabetes mellitus without complications ([place]) 123456 today at 7.5 putting him in the diabetes range. Starting metformin. Counseled on continuing healthy diet changes.  10 year ASCVD risk calculated at over 16%. Add aspirin and statin. Follow up in 3 months   Eye irritation Possibly related to working at computer, may also be allergic. Start pataday drops. Checked visual acuity at 20/20 today. Encouraged scheduling an eye appointment soon given his history of eye irritation and Diabetes     [place]   Patient seen along with [place]. I personally evaluated this patient along with the student, and verified all aspects of the history, physical exam, and medical decision making as documented by the  student. I agree with the student's documentation and have made all necessary edits.  Chrisandra Netters, MD [place]

## 2019-07-09 NOTE — Assessment & Plan Note (Addendum)
A1c today at 7.5 putting him in the diabetes range. Starting metformin. Counseled on continuing healthy diet changes.  10 year ASCVD risk calculated at over 16%. Add aspirin and statin. Follow up in 3 months

## 2019-07-09 NOTE — Patient Instructions (Addendum)
It was great to see you again today!  Start metformin once per day Start atorvastatin daily Start aspirin 81mg  daily  Sent in eye drops Schedule your eye exam  Follow up with me in 3 months  Be well, Dr. Ardelia Mems

## 2019-07-09 NOTE — Assessment & Plan Note (Addendum)
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 Start pataday drops. Checked visual acuity at 20/20 today. Encouraged scheduling an eye appointment soon given his history of eye irritation and Diabetes

## 2019-07-16 DIAGNOSIS — Z23 Encounter for immunization: Secondary | ICD-10-CM | POA: Diagnosis not present

## 2019-08-06 DIAGNOSIS — Z23 Encounter for immunization: Secondary | ICD-10-CM | POA: Diagnosis not present

## 2019-09-24 ENCOUNTER — Ambulatory Visit (INDEPENDENT_AMBULATORY_CARE_PROVIDER_SITE_OTHER): Payer: BLUE CROSS/BLUE SHIELD

## 2019-09-24 ENCOUNTER — Other Ambulatory Visit: Payer: Self-pay

## 2019-09-24 DIAGNOSIS — Z23 Encounter for immunization: Secondary | ICD-10-CM | POA: Diagnosis not present

## 2019-09-25 NOTE — Progress Notes (Signed)
Patient reports to nurse clinic for updated immunizations. After chart review and pulling records from state registry, patient was found to have already received Tdap in 2015. Spoke with Dr. McDiarmid, who advised patient to receive only the pneumovax vaccine today, as Tdap is not currently indicated.   Gave Pneumovax injection in LD. Site unremarkable. Immunization record updated.   Patient will return call to office to schedule office visit with Dr. Ardelia Mems.   Forwarding chart to PCP  Talbot Grumbling, RN

## 2019-10-30 ENCOUNTER — Other Ambulatory Visit: Payer: Self-pay | Admitting: Family Medicine

## 2019-10-30 NOTE — Telephone Encounter (Signed)
Please remind patient to schedule a follow up visit with me Thanks [name], MD

## 2019-11-02 NOTE — Telephone Encounter (Signed)
Pt scheduled for an appt. Isley Weisheit, CMA  

## 2019-11-18 DIAGNOSIS — L814 Other melanin hyperpigmentation: Secondary | ICD-10-CM | POA: Diagnosis not present

## 2019-11-18 DIAGNOSIS — L905 Scar conditions and fibrosis of skin: Secondary | ICD-10-CM | POA: Diagnosis not present

## 2019-11-18 DIAGNOSIS — D229 Melanocytic nevi, unspecified: Secondary | ICD-10-CM | POA: Diagnosis not present

## 2019-11-18 DIAGNOSIS — L821 Other seborrheic keratosis: Secondary | ICD-10-CM | POA: Diagnosis not present

## 2019-11-19 ENCOUNTER — Other Ambulatory Visit: Payer: Self-pay

## 2019-11-19 ENCOUNTER — Ambulatory Visit (INDEPENDENT_AMBULATORY_CARE_PROVIDER_SITE_OTHER): Payer: BLUE CROSS/BLUE SHIELD | Admitting: Family Medicine

## 2019-11-19 VITALS — BP 138/60 | HR 62 | Ht 72.0 in | Wt 226.2 lb

## 2019-11-19 DIAGNOSIS — Z23 Encounter for immunization: Secondary | ICD-10-CM

## 2019-11-19 DIAGNOSIS — Z8639 Personal history of other endocrine, nutritional and metabolic disease: Secondary | ICD-10-CM

## 2019-11-19 DIAGNOSIS — E119 Type 2 diabetes mellitus without complications: Secondary | ICD-10-CM

## 2019-11-19 LAB — POCT GLYCOSYLATED HEMOGLOBIN (HGB A1C): HbA1c, POC (controlled diabetic range): 8.2 % — AB (ref 0.0–7.0)

## 2019-11-19 MED ORDER — CONTOUR NEXT MONITOR W/DEVICE KIT: PACK | 0 refills | Status: DC

## 2019-11-19 MED ORDER — METFORMIN HCL ER 500 MG PO TB24: 1000.0000 mg | ORAL_TABLET | Freq: Every day | ORAL | 1 refills | Status: DC

## 2019-11-19 MED ORDER — MICROLET NEXT LANCING DEVICE MISC: 3 refills | Status: DC

## 2019-11-19 MED ORDER — MICROLET LANCETS MISC
3 refills | Status: DC
Start: 2019-11-19 — End: 2021-07-20

## 2019-11-19 MED ORDER — EMPAGLIFLOZIN 10 MG PO TABS: 10.0000 mg | ORAL_TABLET | Freq: Every day | ORAL | 1 refills | Status: DC

## 2019-11-19 MED ORDER — CONTOUR NEXT TEST VI STRP: ORAL_STRIP | 12 refills | Status: DC

## 2019-11-19 NOTE — Assessment & Plan Note (Signed)
Uncontrolled with A1c of 8.2.  Increase metformin XR to 1000mg  daily Add jardiance 10mg  daily, counseled to hold during periods of dehydration/illness Follow up in 3 months for next A1c

## 2019-11-19 NOTE — Patient Instructions (Signed)
It was great to see you again today!  For diabetes - increase metformin to 2 pills per day. Add jardiance 10mg  daily  Follow up with me in 3 months so we can recheck your A1c.  Call with any questions  Be well, Dr. Ardelia Mems

## 2019-11-19 NOTE — Addendum Note (Signed)
Addended by: [name] on: 11/19/2019 09:07 AM   Modules accepted: Orders

## 2019-11-19 NOTE — Progress Notes (Addendum)
  Date of Visit: 11/19/2019   SUBJECTIVE:   HPI:  Jason Chang presents today for routine follow up.  Diabetes - taking metformin XR 500mg  daily. Tolerating well. Has begun to notice some tingling of his feet on occasion, nothing that bothers him enough to take medication. Overall is feeling well. Got his eyes checked ([facility] in [place]).  OBJECTIVE:   BP 138/60   Pulse 62   Ht 6' (1.829 m)   Wt 226 lb 3.2 oz (102.6 kg)   SpO2 98%   BMI 30.68 kg/m  Gen: no acute distress, pleasant, cooperative HEENT: normocephalic, atraumatic  Heart: regular rate and rhythm, no murmur Lungs: clear to auscultation bilaterally, normal work of breathing  Neuro: alert, speech normal, grossly nonfocaol Ext: No appreciable lower extremity edema bilaterally   ASSESSMENT/PLAN:   Health maintenance:  -urine microalbumin today -flu shot today -will request eye records  Type 2 diabetes mellitus without complications ([place]) Uncontrolled with A1c of 8.2.  Increase metformin XR to 1000mg  daily Add jardiance 10mg  daily, counseled to hold during periods of dehydration/illness Follow up in 3 months for next A1c  Sent in glucometer and supplies by patient request  FOLLOW UP: Follow up in 3 months for next A1c  Jason Chang, [place]

## 2019-11-20 LAB — MICROALBUMIN / CREATININE URINE RATIO
Creatinine, Urine: 149.7 mg/dL
Microalb/Creat Ratio: 9 mg/g creat (ref 0–29)
Microalbumin, Urine: 13.1 ug/mL

## 2019-11-25 ENCOUNTER — Other Ambulatory Visit: Payer: Self-pay

## 2019-11-25 ENCOUNTER — Emergency Department (HOSPITAL_COMMUNITY)
Admission: EM | Admit: 2019-11-25 | Discharge: 2019-11-25 | Disposition: A | Discharge status: Home / Self Care | Payer: BLUE CROSS/BLUE SHIELD | Attending: Emergency Medicine | Admitting: Emergency Medicine

## 2019-11-25 ENCOUNTER — Emergency Department (HOSPITAL_COMMUNITY): Payer: BLUE CROSS/BLUE SHIELD

## 2019-11-25 ENCOUNTER — Encounter (HOSPITAL_COMMUNITY): Payer: Self-pay | Admitting: Emergency Medicine

## 2019-11-25 DIAGNOSIS — R112 Nausea with vomiting, unspecified: Secondary | ICD-10-CM | POA: Insufficient documentation

## 2019-11-25 DIAGNOSIS — I10 Essential (primary) hypertension: Secondary | ICD-10-CM | POA: Insufficient documentation

## 2019-11-25 DIAGNOSIS — N201 Calculus of ureter: Secondary | ICD-10-CM | POA: Insufficient documentation

## 2019-11-25 DIAGNOSIS — Z7984 Long term (current) use of oral hypoglycemic drugs: Secondary | ICD-10-CM | POA: Insufficient documentation

## 2019-11-25 DIAGNOSIS — E119 Type 2 diabetes mellitus without complications: Secondary | ICD-10-CM | POA: Diagnosis not present

## 2019-11-25 DIAGNOSIS — R109 Unspecified abdominal pain: Secondary | ICD-10-CM | POA: Diagnosis not present

## 2019-11-25 DIAGNOSIS — Z7982 Long term (current) use of aspirin: Secondary | ICD-10-CM | POA: Insufficient documentation

## 2019-11-25 LAB — COMPREHENSIVE METABOLIC PANEL
ALT: 26 U/L (ref 0–44)
AST: 25 U/L (ref 15–41)
Albumin: 4.4 g/dL (ref 3.5–5.0)
Alkaline Phosphatase: 41 U/L (ref 38–126)
Anion gap: 10 (ref 5–15)
BUN: 20 mg/dL (ref 6–20)
CO2: 24 mmol/L (ref 22–32)
Calcium: 9.4 mg/dL (ref 8.9–10.3)
Chloride: 100 mmol/L (ref 98–111)
Creatinine, Ser: 1.37 mg/dL — ABNORMAL HIGH (ref 0.61–1.24)
GFR calc Af Amer: >60 mL/min (ref >60)
GFR calc non Af Amer: 57 mL/min — ABNORMAL LOW (ref >60)
Glucose, Bld: 176 mg/dL — ABNORMAL HIGH (ref 70–99)
Potassium: 4.6 mmol/L (ref 3.5–5.1)
Sodium: 134 mmol/L — ABNORMAL LOW (ref 135–145)
Total Bilirubin: 0.8 mg/dL (ref 0.3–1.2)
Total Protein: 6.8 g/dL (ref 6.5–8.1)

## 2019-11-25 LAB — URINALYSIS, ROUTINE W REFLEX MICROSCOPIC
Bacteria, UA: NONE SEEN
Bilirubin Urine: NEGATIVE
Glucose, UA: 150 mg/dL — AB
Ketones, ur: 20 mg/dL — AB
Leukocytes,Ua: NEGATIVE
Nitrite: NEGATIVE
Protein, ur: NEGATIVE mg/dL
RBC / HPF: >50 RBC/hpf — ABNORMAL HIGH (ref 0–5)
Specific Gravity, Urine: 1.027 (ref 1.005–1.030)
pH: 5 (ref 5.0–8.0)

## 2019-11-25 LAB — CBC
HCT: 46.7 % (ref 39.0–52.0)
Hemoglobin: 15.8 g/dL (ref 13.0–17.0)
MCH: 29.7 pg (ref 26.0–34.0)
MCHC: 33.8 g/dL (ref 30.0–36.0)
MCV: 87.8 fL (ref 80.0–100.0)
Platelets: 185 10*3/uL (ref 150–400)
RBC: 5.32 MIL/uL (ref 4.22–5.81)
RDW: 12.2 % (ref 11.5–15.5)
WBC: 10.1 10*3/uL (ref 4.0–10.5)
nRBC: 0 % (ref 0.0–0.2)

## 2019-11-25 MED ORDER — SODIUM CHLORIDE 0.9 % IV BOLUS
1000.0000 mL | Freq: Once | INTRAVENOUS | Status: AC
  Administered 2019-11-25: 1000 mL via INTRAVENOUS

## 2019-11-25 MED ORDER — ONDANSETRON HCL 4 MG/2ML IJ SOLN
4.0000 mg | Freq: Once | INTRAMUSCULAR | Status: AC
  Administered 2019-11-25: 4 mg via INTRAVENOUS
  Filled 2019-11-25: qty 2

## 2019-11-25 MED ORDER — ONDANSETRON HCL 4 MG PO TABS: 4.0000 mg | ORAL_TABLET | Freq: Three times a day (TID) | ORAL | 0 refills | Status: DC | PRN

## 2019-11-25 MED ORDER — OXYCODONE HCL 5 MG PO TABS: 5.0000 mg | ORAL_TABLET | ORAL | 0 refills | Status: AC | PRN

## 2019-11-25 MED ORDER — TAMSULOSIN HCL 0.4 MG PO CAPS: 0.4000 mg | ORAL_CAPSULE | Freq: Every day | ORAL | 0 refills | Status: AC

## 2019-11-25 MED ORDER — HYDROMORPHONE HCL 1 MG/ML IJ SOLN
1.0000 mg | Freq: Once | INTRAMUSCULAR | Status: AC
  Administered 2019-11-25: 1 mg via INTRAVENOUS
  Filled 2019-11-25: qty 1

## 2019-11-25 MED ORDER — TAMSULOSIN HCL 0.4 MG PO CAPS
0.4000 mg | ORAL_CAPSULE | Freq: Every day | ORAL | Status: DC
  Administered 2019-11-25: 0.4 mg via ORAL
  Filled 2019-11-25: qty 1

## 2019-11-25 NOTE — ED Notes (Signed)
Patient verbalizes understanding of discharge instructions. Opportunity for questioning and answers were provided. Armband removed by staff, pt discharged from ED via wheelchair, wife to drive home.

## 2019-11-25 NOTE — ED Notes (Signed)
IV at bedside. 

## 2019-11-25 NOTE — Discharge Instructions (Addendum)
You were seen in the ER for right flank pain  Work-up today revealed a 6-7 mm stone in your right ureter  Spoke to urology Dr. Edward Qualia who recommends outpatient management of this.  Take 500 to 1000 mg of acetaminophen every 6-8 hours for mild to moderate pain.  Take oxycodone 5 mg every 4 hours for breakthrough or severe pain.  Use Zofran for nausea as needed.  Take Flomax to help dilate the ureter.  Increase oral hydration.  Return to the ER if you have worsening or severe symptoms despite medicines, fever than 100.4, inability to urinate

## 2019-11-25 NOTE — ED Provider Notes (Signed)
[facility] EMERGENCY DEPARTMENT Provider Note   CSN: [id number] Arrival date & time: 11/25/19  9390     History Chief Complaint  Patient presents with  . Flank Pain    Jason Chang is a 54 y.o. male with history of hypertension presents to the ER for evaluation of right flank pain that began last night at 10 PM.  Also feels like his entire abdomen is "burning".  States it woke him up from sleep.  The pain comes in waves and is severe at times.  Associated with nausea and vomiting.  Currently pain is 10/10.  Reports he works outside and sweats heavily, his urine always looks dark but this is not new.  Took Alka-seltzer and ibuprofen without relief. Denies fevers.  Denies dysuria.  No history of kidney stones.  No associated chest pain or shortness of breath. HPI     Past Medical History:  Diagnosis Date  . Arthritis   . Diabetes mellitus without complication ([place]) 05/90   diet controlled  . Hyperlipidemia   . Hypertension   . Meniscus tear 02/2013   left    Patient Active Problem List   Diagnosis Date Noted  . Right calf pain 02/10/2019  . Back pain 08/07/2017  . Eye irritation 12/07/2016  . BPPV (benign paroxysmal positional vertigo) 10/19/2016  . Neoplasm of uncertain behavior 33/00/7622  . AK (actinic keratosis) 08/18/2015  . Right hip pain 08/18/2015  . Hyperlipidemia 07/27/2015  . Type 2 diabetes mellitus without complications ([place]) 63/33/5456  . Preventative health care 07/21/2015  . Prehypertension 07/21/2015  . Bilateral hand pain 07/21/2015  . Right knee pain 07/21/2015  . Acute meniscal injury of left knee 03/10/2013    Past Surgical History:  Procedure Laterality Date  . KNEE ARTHROSCOPY Left   . KNEE ARTHROSCOPY Left 03/10/2013   Procedure: LEFT KNEE ARTHROSCOPY WITH PARTIAL MEDIAL MENISCECTOMY;  Surgeon: Mcarthur Rossetti, MD;  Location: [place];  Service: Orthopedics;  Laterality: Left;       Family History  Problem  Relation Age of Onset  . Dementia Mother   . Hyperlipidemia Mother   . Hypertension Mother   . Osteoporosis Mother   . Stroke Mother   . Hyperlipidemia Father   . Hypertension Father   . Stroke Father   . Diabetes Brother   . Colon cancer Neg Hx   . Esophageal cancer Neg Hx   . Rectal cancer Neg Hx   . Stomach cancer Neg Hx     Social History   Tobacco Use  . Smoking status: Never Smoker  . Smokeless tobacco: Never Used  Substance Use Topics  . Alcohol use: No    Alcohol/week: 0.0 standard drinks  . Drug use: No    Home Medications Prior to Admission medications   Medication Sig Start Date End Date Taking? Authorizing Provider  aspirin EC 81 MG tablet Take 1 tablet (81 mg total) by mouth daily. 07/09/19  Yes [name], MD  atorvastatin (LIPITOR) 40 MG tablet Take 1 tablet (40 mg total) by mouth daily. 07/09/19  Yes [name], MD  metFORMIN (GLUCOPHAGE-XR) 500 MG 24 hr tablet Take 2 tablets (1,000 mg total) by mouth daily with breakfast. 11/19/19  Yes [name], MD  Blood Glucose Monitoring Suppl (CONTOUR NEXT MONITOR) w/Device KIT Check blood sugar daily 11/19/19   [name], MD  empagliflozin (JARDIANCE) 10 MG TABS tablet Take 1 tablet (10 mg total) by mouth daily. Patient not taking:  Reported on 11/25/2019 11/19/19   [name], MD  glucose blood (CONTOUR NEXT TEST) test strip Check blood sugar daily 11/19/19   [name], MD  Lancet Devices (MICROLET NEXT LANCING DEVICE) MISC Check blood sugar daily 11/19/19   [name], MD  Microlet Lancets MISC Check blood sugar daily 11/19/19   [name], MD  ondansetron (ZOFRAN) 4 MG tablet Take 1 tablet (4 mg total) by mouth every 8 (eight) hours as needed for nausea or vomiting. 11/25/19   Kinnie Feil, PA-C  oxyCODONE (OXY IR/ROXICODONE) 5 MG immediate release tablet Take 1 tablet (5 mg total) by mouth every 4 (four) hours as needed for up to 3 days for severe  pain. 11/25/19 11/28/19  Kinnie Feil, PA-C  tamsulosin (FLOMAX) 0.4 MG CAPS capsule Take 1 capsule (0.4 mg total) by mouth daily for 15 days. 11/25/19 12/10/19  Kinnie Feil, PA-C    Allergies    Patient has no known allergies.  Review of Systems   Review of Systems  Gastrointestinal: Positive for abdominal pain, nausea and vomiting.  Genitourinary: Positive for flank pain.  All other systems reviewed and are negative.   Physical Exam Updated Vital Signs BP (!) 163/95   Pulse 64   Temp 98.4 F (36.9 C) (Oral)   Resp 15   SpO2 98%   Physical Exam Vitals and nursing note reviewed.  Constitutional:      Appearance: He is well-developed.     Comments: Non toxic.  HENT:     Head: Normocephalic and atraumatic.     Nose: Nose normal.  Eyes:     Conjunctiva/sclera: Conjunctivae normal.  Cardiovascular:     Rate and Rhythm: Normal rate and regular rhythm.     Heart sounds: Normal heart sounds.  Pulmonary:     Effort: Pulmonary effort is normal.     Breath sounds: Normal breath sounds.  Abdominal:     General: Bowel sounds are normal.     Palpations: Abdomen is soft.     Tenderness: There is no abdominal tenderness.     Comments: No G/R/R. No suprapubic or CVA tenderness. Negative Murphy's and McBurney's  Musculoskeletal:        General: Normal range of motion.     Cervical back: Normal range of motion.  Skin:    General: Skin is warm and dry.     Capillary Refill: Capillary refill takes less than 2 seconds.  Neurological:     Mental Status: He is alert.  Psychiatric:        Behavior: Behavior normal.     ED Results / Procedures / Treatments   Labs (all labs ordered are listed, but only abnormal results are displayed) Labs Reviewed  COMPREHENSIVE METABOLIC PANEL - Abnormal; Notable for the following components:      Result Value   Sodium 134 (*)    Glucose, Bld 176 (*)    Creatinine, Ser 1.37 (*)    GFR calc non Af Amer 57 (*)    All other components  within normal limits  URINALYSIS, ROUTINE W REFLEX MICROSCOPIC - Abnormal; Notable for the following components:   APPearance HAZY (*)    Glucose, UA 150 (*)    Hgb urine dipstick LARGE (*)    Ketones, ur 20 (*)    RBC / HPF >50 (*)    All other components within normal limits  CBC    EKG None  Radiology CT Renal Stone Study  Result  Date: 11/25/2019 CLINICAL DATA:  54 year old male with flank pain which woke him from sleep last night. EXAM: CT ABDOMEN AND PELVIS WITHOUT CONTRAST TECHNIQUE: Multidetector CT imaging of the abdomen and pelvis was performed following the standard protocol without IV contrast. COMPARISON:  None. FINDINGS: Lower chest: Mild dependent atelectasis or scarring in the lower lobes greater on the left. Otherwise negative lung bases. No effusion. No cardiomegaly. Hepatobiliary: Negative noncontrast liver and gallbladder. Pancreas: Negative. Spleen: Negative. Adrenals/Urinary Tract: Normal adrenal glands. Negative noncontrast left kidney and ureter. Obstructing 6-7 mm right ureteral calculus located about 5.5 cm from the right ureterovesical junction. Moderate perinephric stranding and fluid, with relatively mild hydronephrosis. Periureteral stranding which abates distal to the stone. The distal right ureter appears normal to the bladder. Unremarkable urinary bladder. No other urinary calculus identified. Stomach/Bowel: Negative large bowel. There is a small 5 mm appendicolith (coronal image 45), but the appendix remains normal. No dilated small bowel. Unremarkable stomach and duodenum. No free air, free fluid. Vascular/Lymphatic: Normal caliber aorta. Vascular patency is not evaluated in the absence of IV contrast. No lymphadenopathy. Reproductive: Negative. Other: No pelvic free fluid. Musculoskeletal: No acute osseous abnormality identified. IMPRESSION: 1. Acute obstructive uropathy on the right with a 6-7 mm right ureteral calculus located about 5.5 cm from the right UVJ.  Probable forniceal rupture. No other urinary calculus identified. 2. Small appendicolith, but the appendix remains normal. Electronically Signed   By: Genevie Ann M.D.   On: 11/25/2019 12:21    Procedures Procedures (including critical care time)  Medications Ordered in ED Medications  tamsulosin (FLOMAX) capsule 0.4 mg (0.4 mg Oral Given 11/25/19 1019)  HYDROmorphone (DILAUDID) injection 1 mg (1 mg Intravenous Given 11/25/19 1128)  sodium chloride 0.9 % bolus 1,000 mL (1,000 mLs Intravenous New Bag/Given 11/25/19 1125)  ondansetron (ZOFRAN) injection 4 mg (4 mg Intravenous Given 11/25/19 1127)    ED Course  I have reviewed the triage vital signs and the nursing notes.  Pertinent labs & imaging results that were available during my care of the patient were reviewed by me and considered in my medical decision making (see chart for details).  Clinical Course as of Nov 25 1330  Wed Nov 25, 2019  1136 Hgb urine dipstick(!): LARGE [CG]  1137 RBC / HPF(!): >50 [CG]  1137 WBC, UA: 0-5 [CG]  1137 Bacteria, UA: NONE SEEN [CG]  1137 Squamous Epithelial / LPF: 0-5 [CG]  1137 WBC: 10.1 [CG]  1137 Creatinine(!): 1.37 [CG]  1247 IMPRESSION: 1. Acute obstructive uropathy on the right with a 6-7 mm right ureteral calculus located about 5.5 cm from the right UVJ. Probable forniceal rupture. No other urinary calculus identified.  2. Small appendicolith, but the appendix remains normal.    CT Renal Laren Everts [CG]    Clinical Course User Index [CG] Arlean Hopping   MDM Rules/Calculators/A&P                           54 y.o. yo with right flank pain, nausea, vomiting.   I obtained additional history from triage, nursing notes and review of medical chart.  Previous medical records available, nursing notes reviewed to obtain more history and assist with MDM  Initial ER work up including laboratory studies and imaging ordered in triage by RN.  I ordered additional laboratory and imaging  studies including CT renal.    I have personally visualized and interpreted ER diagnostic work up including labs  and imaging.    ER work up reveals 6-7 mm right ureteral stone with mild hydronephrosis, forniceal rupture and mild stranding.  Small appendicolith but patient has no reproducible abdominal tenderness.   I ordered medications including dilaudid, zofran, IVF, flomax.   I have ordered continuous cardiac monitoring, pulse ox.  Will plan for serial re-examinations. Close monitoring.   1330: I have re-evaluated the patient, pain improved.  Spoke to urology Dr Edward Qualia who recommends routine treatment for stone including pain meds, OP follow up. No emergent indications or antibiotics.  Discussed this with patient.  Return precautions discussed.   Consults made in the ED: urology   Final Clinical Impression(s) / ED Diagnoses Final diagnoses:  Ureteral stone    Rx / DC Orders ED Discharge Orders         Ordered    tamsulosin (FLOMAX) 0.4 MG CAPS capsule  Daily        11/25/19 1329    oxyCODONE (OXY IR/ROXICODONE) 5 MG immediate release tablet  Every 4 hours PRN        11/25/19 1329    ondansetron (ZOFRAN) 4 MG tablet  Every 8 hours PRN        11/25/19 1329           Arlean Hopping 11/25/19 1332    Virgel Manifold, MD 11/28/19 1548

## 2019-11-25 NOTE — ED Triage Notes (Signed)
Patient with flank pain that started last night, woke him from sleep at 2230.  He states that he drove home from [place], pain continues at this time.  Patient works outside all day long.  No pain with urination.  Patient states that he did vomit x2 on the way home.  Pain does not radiate.

## 2019-11-25 NOTE — ED Notes (Signed)
Two unsuccessful IV attempts.

## 2019-11-27 ENCOUNTER — Encounter: Payer: Self-pay | Admitting: Family Medicine

## 2019-11-27 DIAGNOSIS — N201 Calculus of ureter: Secondary | ICD-10-CM | POA: Diagnosis not present

## 2019-12-01 DIAGNOSIS — N201 Calculus of ureter: Secondary | ICD-10-CM | POA: Diagnosis not present

## 2020-04-12 ENCOUNTER — Other Ambulatory Visit: Payer: Self-pay

## 2020-08-05 ENCOUNTER — Telehealth: Payer: Self-pay | Admitting: Family Medicine

## 2020-08-05 NOTE — Telephone Encounter (Signed)
Please call patient and schedule follow up with PCP (had 3 months listed on follow up from September).  Dorris Singh, MD  Family Medicine Teaching Service

## 2020-08-08 ENCOUNTER — Telehealth: Payer: Self-pay

## 2020-08-08 NOTE — Telephone Encounter (Signed)
Informed patient that he needs to schedule an appointment with PCP.  Patient states that he needs an A1C and he will make appointment after he checks his calendar.  Jason Chang, [place]

## 2020-08-29 ENCOUNTER — Encounter: Payer: Self-pay | Admitting: Family Medicine

## 2020-08-29 ENCOUNTER — Other Ambulatory Visit: Payer: Self-pay

## 2020-08-29 ENCOUNTER — Ambulatory Visit (INDEPENDENT_AMBULATORY_CARE_PROVIDER_SITE_OTHER): Payer: BC Managed Care – PPO

## 2020-08-29 ENCOUNTER — Ambulatory Visit
Admission: RE | Admit: 2020-08-29 | Discharge: 2020-08-29 | Disposition: A | Discharge status: Home / Self Care | Payer: BC Managed Care – PPO | Source: Ambulatory Visit | Attending: Family Medicine | Admitting: Family Medicine

## 2020-08-29 ENCOUNTER — Ambulatory Visit (INDEPENDENT_AMBULATORY_CARE_PROVIDER_SITE_OTHER): Payer: BC Managed Care – PPO | Admitting: Family Medicine

## 2020-08-29 VITALS — BP 148/80 | HR 68 | Ht 72.0 in | Wt 230.0 lb

## 2020-08-29 DIAGNOSIS — M79642 Pain in left hand: Secondary | ICD-10-CM | POA: Diagnosis not present

## 2020-08-29 DIAGNOSIS — E119 Type 2 diabetes mellitus without complications: Secondary | ICD-10-CM

## 2020-08-29 DIAGNOSIS — M79641 Pain in right hand: Secondary | ICD-10-CM | POA: Diagnosis not present

## 2020-08-29 DIAGNOSIS — Z23 Encounter for immunization: Secondary | ICD-10-CM | POA: Diagnosis not present

## 2020-08-29 DIAGNOSIS — M19041 Primary osteoarthritis, right hand: Secondary | ICD-10-CM | POA: Diagnosis not present

## 2020-08-29 LAB — POCT GLYCOSYLATED HEMOGLOBIN (HGB A1C): HbA1c, POC (controlled diabetic range): 8.7 % — AB (ref 0.0–7.0)

## 2020-08-29 MED ORDER — SHINGRIX 50 MCG/0.5ML IM SUSR: 0.5000 mL | INTRAMUSCULAR | 0 refills | Status: AC

## 2020-08-29 MED ORDER — METFORMIN HCL ER 500 MG PO TB24
1000.0000 mg | ORAL_TABLET | Freq: Two times a day (BID) | ORAL | 2 refills | Status: DC
Start: 2020-08-29 — End: 2021-07-20

## 2020-08-29 NOTE — Progress Notes (Signed)
SUBJECTIVE:   CHIEF COMPLAINT: diabetes follow up  HPI:   Jason Chang is a 55 y.o. yo with history notable for type 2 diabetes presenting for diabetes and hand pain.  Diabetes Mr. Lakeman reports he has been 'not a great patient'. Patient reports some increased thirst over past few months. No polyuria. He reports he is taking only metformin 1,000 mg daily. Jardiance made him feel 'dehydrated'. Prefers metformin and has no side effects from this. He is taking his statin. No muscle cramps or weakness.   Elevated BP Reading Initial blood pressure reading elevated denies headaches, chest pain, difficulty breathing.  There is no lower extremity edema.  Hand Pain/Grip Strength  The patient is right-hand dominant.  He works as a Water engineer.  He has noticed 5 or 6 years of what he describes as reduced grip strength and some hand pain at the end of the day.  He has a remote family history of rheumatoid arthritis.  He denies numbness, tingling, weakness, neck pain, shoulder or elbow pain.  Right hand is worse than left.  This is not particular localized to one digit but is largely over the [facility] and MCP joints.   PERTINENT  PMH / PSH/Family/Social History : type 2 diabetes, HTN, dyslipidemia   OBJECTIVE:   BP (!) 148/80   Pulse 68   Ht 6' (1.829 m)   Wt 230 lb (104.3 kg)   SpO2 97%   BMI 31.19 kg/m   Today's weight:  Last Weight  Most recent update: 08/29/2020  8:36 AM    Weight  104.3 kg (230 lb)            Review of prior weights: [employer]   08/29/20 0835  Weight: 230 lb (104.3 kg)     Cardiac: Regular rate and rhythm. Normal S1/S2. No murmurs, rubs, or gallops appreciated. Lungs: Clear bilaterally to ascultation.  Psych: Pleasant and appropriate   No lower extremity edema Derm  Small nonhealing erythematous lesion with overlying plaque on L zygomatic process Bilateral hands examined normal radial pulses no deformity.  He is no erythema or significant  joint swelling.  Slightly reduced grip strength on right as compared to left.  Preserved median and ulnar nerve strength.  Normal sensation.  No pain with loading of CMC joint of first digit.  Patient is recent labs which are scanned into the media tab.  LDL of 83, creatinine is 0.91 hemoglobin hematocrit appropriate.  ASSESSMENT/PLAN:   Type 2 diabetes mellitus without complications (HCC) Not currently at goal.  We discussed dietary, exercise and medication changes.  He is hesitant to restart Jardiance.  We will increase his metformin to the maximal dose and have him follow-up closely with his primary care physician.  Discussed importance of follow-up with ophthalmology, referral placed.  He has his eye doctor's number.  Bilateral hand pain, suspect osteoarthritis, findings not consistent with nerve injury or other etiology.  Less likely but also considered rheumatologic condition.  Will obtain right hand x-ray to start with, consider Voltaren gel.  Actinic keratoses, suspected patient has follow-up with dermatology for this  Elevated BP, repeat continues to be slightly elevated at 148/80. Discussed, patient  would like to increase water, reduce salt, increase activity and follow up with PCP. NO symptoms of elevated BP.   HCM Recommended Eye exam Rx for [place] vaccine printed COVID booster given    Dorris Singh, MD  [facility]

## 2020-08-29 NOTE — Patient Instructions (Signed)
It was wonderful to see you today.  Please bring ALL of your medications with you to every visit.   Today we talked about:  --Returning in 2-3 weeks for a blood pressure check with Dr. Ardelia Mems- please schedule at the front  -- Increase metformin to 2 tabs TWICE a day--I sent a refill to your pharmacy  -- We will likely need to start Jardiance (or another agent) at follow up---I would like to see your A1C near 7.0. It is 8.5 today.  -You can go to [facility] imaging anytime for your COVID vaccine   --Please go to the Eye Doctor  --Take your prescription to the pharmacy for your shingles vaccine   Thank you for choosing [facility].   Please call [phone] with any questions about today's appointment.  Please be sure to schedule follow up at the front  desk before you leave today.   Dorris Singh, MD  Family Medicine

## 2020-08-29 NOTE — Assessment & Plan Note (Signed)
Not currently at goal.  We discussed dietary, exercise and medication changes.  He is hesitant to restart Jardiance.  We will increase his metformin to the maximal dose and have him follow-up closely with his primary care physician.  Discussed importance of follow-up with ophthalmology, referral placed.  He has his eye doctor's number.

## 2020-08-31 ENCOUNTER — Telehealth: Payer: Self-pay | Admitting: Family Medicine

## 2020-08-31 DIAGNOSIS — M19041 Primary osteoarthritis, right hand: Secondary | ICD-10-CM

## 2020-08-31 MED ORDER — DICLOFENAC SODIUM 1 % EX GEL: 4.0000 g | Freq: Four times a day (QID) | CUTANEOUS | 1 refills | Status: DC | PRN

## 2020-08-31 NOTE — Telephone Encounter (Signed)
Called with results. Mild OA on xray. Rx for voltaren to pharmacy.  Dorris Singh, MD  Family Medicine Teaching Service

## 2020-09-05 ENCOUNTER — Telehealth: Payer: Self-pay

## 2020-09-05 NOTE — Telephone Encounter (Signed)
Received fax from pharmacy, PA needed on Voltaren gel.  Clinical questions submitted via Cover My Meds.  Waiting on response, could take up to 72 hours.  Cover My Meds info: Key: [id number]  Talbot Grumbling, RN

## 2020-09-05 NOTE — Telephone Encounter (Signed)
Please see below determination.      Called pharmacy with approval. Patient already picked up medication. However medication is approved from 09/05/2020-09/06/2023.   Talbot Grumbling, RN

## 2020-10-04 ENCOUNTER — Encounter: Payer: Self-pay | Admitting: Family Medicine

## 2020-10-04 ENCOUNTER — Ambulatory Visit (INDEPENDENT_AMBULATORY_CARE_PROVIDER_SITE_OTHER): Payer: BC Managed Care – PPO | Admitting: Family Medicine

## 2020-10-04 ENCOUNTER — Other Ambulatory Visit: Payer: Self-pay

## 2020-10-04 DIAGNOSIS — E119 Type 2 diabetes mellitus without complications: Secondary | ICD-10-CM

## 2020-10-04 DIAGNOSIS — R030 Elevated blood-pressure reading, without diagnosis of hypertension: Secondary | ICD-10-CM | POA: Diagnosis not present

## 2020-10-04 NOTE — Patient Instructions (Addendum)
It was great to see you again today!  Continue on current medications Keep working on healthy eating and exercise Follow up in 2 months to recheck your A1c  Will do pneumonia shot at next visit Get shingles shot from your pharmacy  Be well, Dr. Ardelia Mems

## 2020-10-04 NOTE — Assessment & Plan Note (Signed)
Blood pressure at goal under 140/90 today. Offered medications to lower further but patient prefers to avoid additional medication at this time Will continue to monitor

## 2020-10-04 NOTE — Progress Notes (Signed)
  Date of Visit: 10/04/2020   SUBJECTIVE:   HPI:  Jason Chang presents today for routine follow up.  Diabetes - recently saw Dr. Owens Shark at which time metformin was increased to 1000mg  XR twice daily. He is tolerating this well. Did not like jardiance so has not restarted it. Does not check sugars at home. Has upcoming eye appointment in Sept at [facility] in [place].  Blood pressure elevation - was elevated last visit. Checks at home and sometimes gets 161W systolic. Desires to avoid blood pressure medications if possible.   OBJECTIVE:   BP 138/65   Pulse 66   Ht 6' (1.829 m)   Wt 227 lb 12.8 oz (103.3 kg)   SpO2 97%   BMI 30.90 kg/m  Gen: no acute distress, pleasant, cooperative HEENT: normocephalic, atraumatic  Heart: regular rate and rhythm, no murmur Lungs: clear to auscultation bilaterally, normal work of breathing  Neuro: alert, speech normal, grossly nonfocal Ext: Diabetic foot exam: 2+ DP pulses bilat, normal monofilament testing bilaterally. No lesions or significant calluses.   ASSESSMENT/PLAN:   Health maintenance:  -normal foot exam today -planned to do PCV20 today but we were out; will administer at next office visit -advised to get shingrix, has rx already -has eye exam scheduled in early Sept at [facility] in [place]  Type 2 diabetes mellitus without complications ([place]) Tolerating higher dose of metformin fine Reviewed exercise & nutrition recommendations today Follow up in 2 months for next A1c  Prehypertension Blood pressure at goal under 140/90 today. Offered medications to lower further but patient prefers to avoid additional medication at this time Will continue to monitor  Plan to check lipids at next visit, advised to come fasting  FOLLOW UP: Follow up in 2 mos for next A1c and lipids  [place] J. Ardelia Mems, [place]

## 2020-10-04 NOTE — Assessment & Plan Note (Signed)
Tolerating higher dose of metformin fine Reviewed exercise & nutrition recommendations today Follow up in 2 months for next A1c

## 2020-11-22 LAB — HM DIABETES EYE EXAM

## 2020-12-13 ENCOUNTER — Ambulatory Visit: Payer: BC Managed Care – PPO | Admitting: Family Medicine

## 2020-12-20 DIAGNOSIS — L819 Disorder of pigmentation, unspecified: Secondary | ICD-10-CM | POA: Diagnosis not present

## 2020-12-20 DIAGNOSIS — L718 Other rosacea: Secondary | ICD-10-CM | POA: Diagnosis not present

## 2020-12-20 DIAGNOSIS — L814 Other melanin hyperpigmentation: Secondary | ICD-10-CM | POA: Diagnosis not present

## 2020-12-20 DIAGNOSIS — L821 Other seborrheic keratosis: Secondary | ICD-10-CM | POA: Diagnosis not present

## 2020-12-20 DIAGNOSIS — L570 Actinic keratosis: Secondary | ICD-10-CM | POA: Diagnosis not present

## 2021-01-03 ENCOUNTER — Other Ambulatory Visit: Payer: Self-pay

## 2021-01-03 ENCOUNTER — Ambulatory Visit (INDEPENDENT_AMBULATORY_CARE_PROVIDER_SITE_OTHER): Payer: BC Managed Care – PPO | Admitting: Family Medicine

## 2021-01-03 VITALS — BP 140/80 | HR 67 | Wt 225.2 lb

## 2021-01-03 DIAGNOSIS — E119 Type 2 diabetes mellitus without complications: Secondary | ICD-10-CM | POA: Diagnosis not present

## 2021-01-03 DIAGNOSIS — E785 Hyperlipidemia, unspecified: Secondary | ICD-10-CM | POA: Diagnosis not present

## 2021-01-03 DIAGNOSIS — M25551 Pain in right hip: Secondary | ICD-10-CM

## 2021-01-03 DIAGNOSIS — Z23 Encounter for immunization: Secondary | ICD-10-CM | POA: Diagnosis not present

## 2021-01-03 LAB — POCT GLYCOSYLATED HEMOGLOBIN (HGB A1C): HbA1c, POC (controlled diabetic range): 8.4 % — AB (ref 0.0–7.0)

## 2021-01-03 MED ORDER — SHINGRIX 50 MCG/0.5ML IM SUSR: 0.5000 mL | Freq: Once | INTRAMUSCULAR | 1 refills | Status: AC

## 2021-01-03 MED ORDER — EMPAGLIFLOZIN 10 MG PO TABS: 10.0000 mg | ORAL_TABLET | Freq: Every day | ORAL | 1 refills | Status: DC

## 2021-01-03 NOTE — Assessment & Plan Note (Addendum)
A1c today is 8.4. Patient has had difficulty exercising due to hip pain. Discussed restarting Jardiance and ensuring adequate hydration, and patient is amenable. Counseled on letting us know in one month if he is tolerating the medication so that we can increase dose for better control of BG. -Restart Jardiance 10 mg daily, will follow up for potential increase to 25 mg (patient to call in 1 month if tolerating 10mg ) -Obtain BMP, microalbumin/Cr ratio to assess renal function -Obtain lipid panel to assess lipid burden -follow up in January for next A1c - patient aware I will be out on maternity leave and he can schedule with any physician here

## 2021-01-03 NOTE — Progress Notes (Addendum)
error 

## 2021-01-03 NOTE — Progress Notes (Signed)
SUBJECTIVE:   CHIEF COMPLAINT / HPI:   Jason Chang (MRN: [id number]) is a 55 y.o. male with a history of T2DM, HLD, and elevated BP who presents for A1c follow up and evaluation of hip pain.  Hip Pain Patient states he has had dull hip pain for the last couple of months. He has been told before it is bursitis of the hip. He states at times, the pain moves down his leg into his thigh and lower leg. The pain is worse at night when lying in bed but is unrelated to positioning. He has tried Tylenol for the pain one time and experienced marked improvement. He denies any erythema, warmth, or shooting pains. 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  [name] is eager to get his A1c checked today. He is unsure of what the number will be, as he has been less active due to his hip pain and his diet has not been as good. He has been taking his metformin XR 1000 mg twice daily as prescribed. His recent checkup at the eye doctor was overall normal, though they did increase his glasses prescription.  Hyperlipidemia - taking atorvastatin 40mg  daily. Fasting today.  PERTINENT  PMH / PSH: T2DM, elevated BP, HLD; works as Water engineer  OBJECTIVE:   BP 140/80   Pulse 67   Wt 225 lb 3.2 oz (102.2 kg)   SpO2 100%   BMI 30.54 kg/m    PHYSICAL EXAM  GEN: Well-developed, in NAD HEAD: NCAT CVS: RRR, normal S1/S2, no murmurs, rubs, gallops RESP: Breathing comfortably on RA, no retractions, wheezes, rhonchi, or crackles MSK: Normal gait, normal strength of lower extremities bilaterally, mild tenderness with palpation of right hip overlying R greater trochanter  ASSESSMENT/PLAN:   Health Maintenance -PCV20, flu vaccines given today -Declines COVID; has gotten 4 doses but forgot to bring vaccine card, advised to get bivalent COVID vaccine at his pharmacy -printed Shingrix Rx to get at pharmacy  Type 2  diabetes mellitus without complications ([place]) E8B today is 8.4. Patient has had difficulty exercising due to hip pain. Discussed restarting Jardiance and ensuring adequate hydration, and patient is amenable. Counseled on letting us know in one month if he is tolerating the medication so that we can increase dose for better control of BG. -Restart Jardiance 10 mg daily, will follow up for potential increase to 25 mg (patient to call in 1 month if tolerating 10mg ) -Obtain BMP, microalbumin/Cr ratio to assess renal function -Obtain lipid panel to assess lipid burden -follow up in January for next A1c - patient aware I will be out on maternity leave and he can schedule with any physician here  Hyperlipidemia Update lipids today, titrate statin as needed based on results  Right hip pain Patient's pain along right hip and buttock likely trochanteric bursitis exacerbated by activity at work. Given marked improvement with Tylenol and lack of trauma or infection, do not suspect worrisome conditions such as hip fracture, septic arthritis, or avascular necrosis. -Counseled on conservative management with rest and Tylenol as needed for pain, follow up if no improvement -handout given on trochanteric bursitis rehab exercises   Ethelene Hal, medical student [place]  Patient seen along with [place]. I personally evaluated this patient along with the student, and verified all aspects of the history, physical exam, and medical decision making as documented by  the student. I agree with the student's documentation and have made all necessary edits.  Chrisandra Netters, MD  [facility]

## 2021-01-03 NOTE — Assessment & Plan Note (Signed)
Patient's pain along right hip and buttock likely trochanteric bursitis exacerbated by activity at work. Given marked improvement with Tylenol and lack of trauma or infection, do not suspect worrisome conditions such as hip fracture, septic arthritis, or avascular necrosis. -Counseled on conservative management with rest and Tylenol as needed for pain, follow up if no improvement -handout given on trochanteric bursitis rehab exercises

## 2021-01-03 NOTE — Patient Instructions (Signed)
It was great to see you again today!  Ok to use tylenol for your hip, see exercises below.  Restart jardiance 10mg  daily. Sent this in for you  Checking kidneys and cholesterol today, also protein level in urine  Flu shot and pneumonia shot today  Recommend you get the updated COVID booster at your pharmacy  Take shingles vaccine prescription to your pharmacy to get it there.  Follow up in January for your next A1c  Be well, Dr. Ardelia Mems    Hip Bursitis Rehab Ask your health care provider which exercises are safe for you. Do exercises exactly as told by your health care provider and adjust them as directed. It is normal to feel mild stretching, pulling, tightness, or discomfort as you do these exercises. Stop right away if you feel sudden pain or your pain gets worse. Do not begin these exercises until told by your health care provider. Stretching exercise This exercise warms up your muscles and joints and improves the movement and flexibility of your hip. This exercise also helps to relieve pain and stiffness. Iliotibial band stretch An iliotibial band is a strong band of muscle tissue that runs from the outer side of your hip to the outer side of your thigh and knee. Lie on your side with your left / right leg in the top position. Bend your left / right knee and grab your ankle. Stretch out your bottom arm to help you balance. Slowly bring your knee back so your thigh is behind your body. Slowly lower your knee toward the floor until you feel a gentle stretch on the outside of your left / right thigh. If you do not feel a stretch and your knee will not fall farther, place the heel of your other foot on top of your knee and pull your knee down toward the floor with your foot. Hold this position for __________ seconds. Slowly return to the starting position. Repeat __________ times. Complete this exercise __________ times a day. Strengthening exercises These exercises build strength  and endurance in your hip and pelvis. Endurance is the ability to use your muscles for a long time, even after they get tired. Bridge This exercise strengthens the muscles that move your thigh backward (hip extensors). Lie on your back on a firm surface with your knees bent and your feet flat on the floor. Tighten your buttocks muscles and lift your buttocks off the floor until your trunk is level with your thighs. Do not arch your back. You should feel the muscles working in your buttocks and the back of your thighs. If you do not feel these muscles, slide your feet 1-2 inches (2.5-5 cm) farther away from your buttocks. If this exercise is too easy, try doing it with your arms crossed over your chest. Hold this position for __________ seconds. Slowly lower your hips to the starting position. Let your muscles relax completely after each repetition. Repeat __________ times. Complete this exercise __________ times a day. Squats This exercise strengthens the muscles in front of your thigh and knee (quadriceps). Stand in front of a table, with your feet and knees pointing straight ahead. You may rest your hands on the table for balance but not for support. Slowly bend your knees and lower your hips like you are going to sit in a chair. Keep your weight over your heels, not over your toes. Keep your lower legs upright so they are parallel with the table legs. Do not let your hips go lower  than your knees. Do not bend lower than told by your health care provider. If your hip pain increases, do not bend as low. Hold the squat position for __________ seconds. Slowly push with your legs to return to standing. Do not use your hands to pull yourself to standing. Repeat __________ times. Complete this exercise __________ times a day. Hip hike Stand sideways on a bottom step. Stand on your left / right leg with your other foot unsupported next to the step. You can hold on to the railing or wall for  balance if needed. Keep your knees straight and your torso square. Then lift your left / right hip up toward the ceiling. Hold this position for __________ seconds. Slowly let your left / right hip lower toward the floor, past the starting position. Your foot should get closer to the floor. Do not lean or bend your knees. Repeat __________ times. Complete this exercise __________ times a day. Single leg stand Without shoes, stand near a railing or in a doorway. You may hold on to the railing or door frame as needed for balance. Squeeze your left / right buttock muscles, then lift up your other foot. Do not let your left / right hip push out to the side. It is helpful to stand in front of a mirror for this exercise so you can watch your hip. Hold this position for __________ seconds. Repeat __________ times. Complete this exercise __________ times a day. This information is not intended to replace advice given to you by your health care provider. Make sure you discuss any questions you have with your health care provider. Document Revised: 06/30/2018 Document Reviewed: 06/30/2018 Elsevier Patient Education  [employer].

## 2021-01-03 NOTE — Assessment & Plan Note (Signed)
Update lipids today, titrate statin as needed based on results

## 2021-01-04 LAB — BASIC METABOLIC PANEL
BUN/Creatinine Ratio: 15 (ref 9–20)
BUN: 14 mg/dL (ref 6–24)
CO2: 26 mmol/L (ref 20–29)
Calcium: 10 mg/dL (ref 8.7–10.2)
Chloride: 99 mmol/L (ref 96–106)
Creatinine, Ser: 0.94 mg/dL (ref 0.76–1.27)
Glucose: 137 mg/dL — ABNORMAL HIGH (ref 70–99)
Potassium: 4.8 mmol/L (ref 3.5–5.2)
Sodium: 142 mmol/L (ref 134–144)
eGFR: 95 mL/min/{1.73_m2} (ref >59)

## 2021-01-04 LAB — MICROALBUMIN / CREATININE URINE RATIO
Creatinine, Urine: 140.7 mg/dL
Microalb/Creat Ratio: 3 mg/g creat (ref 0–29)
Microalbumin, Urine: 3.7 ug/mL

## 2021-01-04 LAB — LIPID PANEL
Chol/HDL Ratio: 4.4 ratio (ref 0.0–5.0)
Cholesterol, Total: 149 mg/dL (ref 100–199)
HDL: 34 mg/dL — ABNORMAL LOW (ref >39)
LDL Chol Calc (NIH): 96 mg/dL (ref 0–99)
Triglycerides: 101 mg/dL (ref 0–149)
VLDL Cholesterol Cal: 19 mg/dL (ref 5–40)

## 2021-01-06 ENCOUNTER — Encounter: Payer: Self-pay | Admitting: Family Medicine

## 2021-02-28 DIAGNOSIS — L570 Actinic keratosis: Secondary | ICD-10-CM | POA: Diagnosis not present

## 2021-02-28 DIAGNOSIS — L578 Other skin changes due to chronic exposure to nonionizing radiation: Secondary | ICD-10-CM | POA: Diagnosis not present

## 2021-06-11 DIAGNOSIS — J329 Chronic sinusitis, unspecified: Secondary | ICD-10-CM | POA: Diagnosis not present

## 2021-06-11 DIAGNOSIS — B9689 Other specified bacterial agents as the cause of diseases classified elsewhere: Secondary | ICD-10-CM | POA: Diagnosis not present

## 2021-07-01 ENCOUNTER — Other Ambulatory Visit: Payer: Self-pay | Admitting: Family Medicine

## 2021-07-03 NOTE — Telephone Encounter (Signed)
Please let patient know I am refilling this medication, but he needs to schedule an appointment with me.   Thanks, Thadius Smisek J Liany Mumpower, MD  

## 2021-07-19 NOTE — Progress Notes (Signed)
?  Date of Visit: 07/20/2021  ? ?SUBJECTIVE:  ? ?HPI: ? ?Jason Chang presents today for routine follow up.  ? ?Diabetes - last visit A1c was 8.4 while on metformin XR '1000mg'$  twice daily. Admitted to dietary indiscretions at that visit, and jardiance '10mg'$  daily was restarted. A1c today is 6.2. patient reports he has only been taking metformin XR '1000mg'$  once daily in AM, cannot remember to take evening dose. ? ?L shoulder pain, up L side of neck, pain/numbness feeling in neck area. Hasn't tried anything for it. ? ?OBJECTIVE:  ? ?BP (!) 158/80   Pulse 75   Wt 215 lb 12.8 oz (97.9 kg)   SpO2 99%   BMI 29.27 kg/m?  ?Gen: no acute distress, pleasant, cooperative ?HEENT: normocephalic, atraumatic.  ?Heart: regular rate and rhythm, no murmur ?Lungs: clear to auscultation bilaterally, normal work of breathing  ?Neuro: alert, speech normal, grossly nonfocal ?Ext: Full range of motion of neck. Some mild tenderness of L trapezius muscle over neck and posterior shoulder area. Full range of motion of L shoulder with abduction, adduction. Negative empty can, negative neers. Grip 5/5 bilaterally. ? ?ASSESSMENT/PLAN:  ? ?Health maintenance:  ?-reminded of shingrix vaccine - has rx at home ?-declines COVID booster today ? ?Type 2 diabetes mellitus without complications ([place]) ?A1c excellent today, congratulated on 10lb weight loss as well ?Continue present medications - jardiance '10mg'$  daily and metformin XR '1000mg'$  daily. ?Follow up to recheck A1c in 6 months. ? ?Elevated blood pressure reading ?Blood pressure up today multiple times, unclear if white coat hypertension or essential hypertension ?Gave option of treating with antihypertensives vs returning in 1 week for recheck ?Patient opts for recheck, he will schedule RN visit in 1 week. If remains high at that visit will need to start medication. ? ?Left shoulder pain ?Exam and history consistent with trapezius muscle irritation/strain. No signs of rotator cuff impingement on  exam. ?Recommend trial of muscle rub, heating pad, exercises. Handout given. ?Follow up if not improving. ? ?FOLLOW UP: ?Follow up in 1 week for RN BP check ? ?[place]. Ardelia Mems, MD ?[facility] ?

## 2021-07-20 ENCOUNTER — Ambulatory Visit (INDEPENDENT_AMBULATORY_CARE_PROVIDER_SITE_OTHER): Payer: BC Managed Care – PPO | Admitting: Family Medicine

## 2021-07-20 ENCOUNTER — Other Ambulatory Visit: Payer: Self-pay

## 2021-07-20 ENCOUNTER — Encounter: Payer: Self-pay | Admitting: Family Medicine

## 2021-07-20 VITALS — BP 158/80 | HR 75 | Wt 215.8 lb

## 2021-07-20 DIAGNOSIS — G8929 Other chronic pain: Secondary | ICD-10-CM

## 2021-07-20 DIAGNOSIS — R030 Elevated blood-pressure reading, without diagnosis of hypertension: Secondary | ICD-10-CM | POA: Diagnosis not present

## 2021-07-20 DIAGNOSIS — E119 Type 2 diabetes mellitus without complications: Secondary | ICD-10-CM | POA: Diagnosis not present

## 2021-07-20 DIAGNOSIS — M25512 Pain in left shoulder: Secondary | ICD-10-CM | POA: Diagnosis not present

## 2021-07-20 LAB — POCT GLYCOSYLATED HEMOGLOBIN (HGB A1C): HbA1c, POC (controlled diabetic range): 6.2 % (ref 0.0–7.0)

## 2021-07-20 MED ORDER — METFORMIN HCL ER 500 MG PO TB24: 1000.0000 mg | ORAL_TABLET | Freq: Every day | ORAL | 2 refills | Status: DC

## 2021-07-20 MED ORDER — EMPAGLIFLOZIN 10 MG PO TABS: ORAL_TABLET | ORAL | 2 refills | Status: DC

## 2021-07-20 NOTE — Assessment & Plan Note (Signed)
A1c excellent today, congratulated on 10lb weight loss as well ?Continue present medications - jardiance '10mg'$  daily and metformin XR '1000mg'$  daily. ?Follow up to recheck A1c in 6 months. ?

## 2021-07-20 NOTE — Assessment & Plan Note (Signed)
Blood pressure up today multiple times, unclear if white coat hypertension or essential hypertension ?Gave option of treating with antihypertensives vs returning in 1 week for recheck ?Patient opts for recheck, he will schedule RN visit in 1 week. If remains high at that visit will need to start medication. ?

## 2021-07-20 NOTE — Patient Instructions (Addendum)
It was great to see you again today! ? ?Stay on current medicines. Ok to just take the metformin once per day. Your A1c is excellent. ? ?Keep up the great work. ? ?Come back in 1 week to recheck your blood pressure here again. If it remains high, will need to start some blood pressure medication. ? ?Be well, ?Dr. Ardelia Mems ? ?Cervical Strain and Sprain Rehab ?Ask your health care provider which exercises are safe for you. Do exercises exactly as told by your health care provider and adjust them as directed. It is normal to feel mild stretching, pulling, tightness, or discomfort as you do these exercises. Stop right away if you feel sudden pain or your pain gets worse. Do not begin these exercises until told by your health care provider. ?Stretching and range-of-motion exercises ?Cervical side bending ? ?Using good posture, sit on a stable chair or stand up. ?Without moving your shoulders, slowly tilt your left / right ear to your shoulder until you feel a stretch in the opposite side neck muscles. You should be looking straight ahead. ?Hold for __________ seconds. ?Repeat with the other side of your neck. ?Repeat __________ times. Complete this exercise __________ times a day. ?Cervical rotation ? ?Using good posture, sit on a stable chair or stand up. ?Slowly turn your head to the side as if you are looking over your left / right shoulder. ?Keep your eyes level with the ground. ?Stop when you feel a stretch along the side and the back of your neck. ?Hold for __________ seconds. ?Repeat this by turning to your other side. ?Repeat __________ times. Complete this exercise __________ times a day. ?Thoracic extension and pectoral stretch ? ?Roll a towel or a small blanket so it is about 4 inches (10 cm) in diameter. ?Lie down on your back on a firm surface. ?Put the towel in the middle of your back across your spine. It should not be under your shoulder blades. ?Put your hands behind your head and let your elbows fall  out to your sides. ?Hold for __________ seconds. ?Repeat __________ times. Complete this exercise __________ times a day. ?Strengthening exercises ?Isometric upper cervical flexion ? ?Lie on your back with a thin pillow behind your head and a small rolled-up towel under your neck. ?Gently tuck your chin toward your chest and nod your head down to look toward your feet. Do not lift your head off the pillow. ?Hold for __________ seconds. ?Release the tension slowly. Relax your neck muscles completely before you repeat this exercise. ?Repeat __________ times. Complete this exercise __________ times a day. ?Isometric cervical extension ? ?Stand about 6 inches (15 cm) away from a wall, with your back facing the wall. ?Place a soft object, about 6-8 inches (15-20 cm) in diameter, between the back of your head and the wall. A soft object could be a small pillow, a ball, or a folded towel. ?Gently tilt your head back and press into the soft object. Keep your jaw and forehead relaxed. ?Hold for __________ seconds. ?Release the tension slowly. Relax your neck muscles completely before you repeat this exercise. ?Repeat __________ times. Complete this exercise __________ times a day. ?Posture and body mechanics ?Body mechanics refers to the movements and positions of your body while you do your daily activities. Posture is part of body mechanics. Good posture and healthy body mechanics can help to relieve stress in your body's tissues and joints. Good posture means that your spine is in its natural S-curve position (your  spine is neutral), your shoulders are pulled back slightly, and your head is not tipped forward. The following are general guidelines for applying improved posture and body mechanics to your everyday activities. ?Sitting ? ?When sitting, keep your spine neutral and keep your feet flat on the floor. Use a footrest, if necessary, and keep your thighs parallel to the floor. Avoid rounding your shoulders, and avoid  tilting your head forward. ?When working at a desk or a computer, keep your desk at a height where your hands are slightly lower than your elbows. Slide your chair under your desk so you are close enough to maintain good posture. ?When working at a computer, place your monitor at a height where you are looking straight ahead and you do not have to tilt your head forward or downward to look at the screen. ?Standing ? ?When standing, keep your spine neutral and keep your feet about hip-width apart. Keep a slight bend in your knees. Your ears, shoulders, and hips should line up. ?When you do a task in which you stand in one place for a long time, place one foot up on a stable object that is 2-4 inches (5-10 cm) high, such as a footstool. This helps keep your spine neutral. ?Resting ?When lying down and resting, avoid positions that are most painful for you. Try to support your neck in a neutral position. You can use a contour pillow or a small rolled-up towel. Your pillow should support your neck but not push on it. ?This information is not intended to replace advice given to you by your health care provider. Make sure you discuss any questions you have with your health care provider. ?Document Revised: 01/23/2021 Document Reviewed: 01/23/2021 ?Elsevier Patient Education ? [place]. ? ?

## 2021-07-20 NOTE — Assessment & Plan Note (Signed)
Exam and history consistent with trapezius muscle irritation/strain. No signs of rotator cuff impingement on exam. ?Recommend trial of muscle rub, heating pad, exercises. Handout given. ?Follow up if not improving. ?

## 2021-07-27 ENCOUNTER — Ambulatory Visit: Payer: BC Managed Care – PPO

## 2021-07-27 VITALS — BP 134/78

## 2021-07-27 DIAGNOSIS — Z0130 Encounter for examination of blood pressure without abnormal findings: Secondary | ICD-10-CM

## 2021-07-27 NOTE — Progress Notes (Signed)
Patient presents in nurse clinic for BP check.  ? ?Patient currently is not on any BP control medications.  ? ?BP today 148/82, however recheck ~15 minutes later 134/78. ? ?Patient reports home BP readings have been ~130s/80s. ? ?Will forward to PCP.  ?

## 2021-07-28 NOTE — Progress Notes (Signed)
Blood pressure is acceptable off medications. ?Continue to monitor at future visits. ? ?[name], MD  ?

## 2021-08-01 ENCOUNTER — Other Ambulatory Visit: Payer: Self-pay

## 2021-08-02 MED ORDER — ATORVASTATIN CALCIUM 40 MG PO TABS: 40.0000 mg | ORAL_TABLET | Freq: Every day | ORAL | 3 refills | Status: DC

## 2021-08-08 DIAGNOSIS — L821 Other seborrheic keratosis: Secondary | ICD-10-CM | POA: Diagnosis not present

## 2021-08-08 DIAGNOSIS — D225 Melanocytic nevi of trunk: Secondary | ICD-10-CM | POA: Diagnosis not present

## 2021-08-08 DIAGNOSIS — L814 Other melanin hyperpigmentation: Secondary | ICD-10-CM | POA: Diagnosis not present

## 2021-08-08 DIAGNOSIS — L570 Actinic keratosis: Secondary | ICD-10-CM | POA: Diagnosis not present

## 2022-02-12 DIAGNOSIS — L609 Nail disorder, unspecified: Secondary | ICD-10-CM | POA: Diagnosis not present

## 2022-02-12 DIAGNOSIS — L578 Other skin changes due to chronic exposure to nonionizing radiation: Secondary | ICD-10-CM | POA: Diagnosis not present

## 2022-02-12 DIAGNOSIS — D225 Melanocytic nevi of trunk: Secondary | ICD-10-CM | POA: Diagnosis not present

## 2022-02-12 DIAGNOSIS — L821 Other seborrheic keratosis: Secondary | ICD-10-CM | POA: Diagnosis not present

## 2022-02-12 DIAGNOSIS — L814 Other melanin hyperpigmentation: Secondary | ICD-10-CM | POA: Diagnosis not present

## 2022-02-12 DIAGNOSIS — L570 Actinic keratosis: Secondary | ICD-10-CM | POA: Diagnosis not present

## 2022-02-23 DIAGNOSIS — M25562 Pain in left knee: Secondary | ICD-10-CM | POA: Diagnosis not present

## 2022-03-13 LAB — HM DIABETES EYE EXAM

## 2022-03-18 DIAGNOSIS — S5001XA Contusion of right elbow, initial encounter: Secondary | ICD-10-CM | POA: Diagnosis not present

## 2022-03-22 ENCOUNTER — Ambulatory Visit (INDEPENDENT_AMBULATORY_CARE_PROVIDER_SITE_OTHER): Payer: BC Managed Care – PPO | Admitting: Family Medicine

## 2022-03-22 ENCOUNTER — Encounter: Payer: Self-pay | Admitting: Family Medicine

## 2022-03-22 VITALS — BP 142/92 | HR 64 | Ht 72.0 in | Wt 219.4 lb

## 2022-03-22 DIAGNOSIS — E785 Hyperlipidemia, unspecified: Secondary | ICD-10-CM | POA: Diagnosis not present

## 2022-03-22 DIAGNOSIS — E119 Type 2 diabetes mellitus without complications: Secondary | ICD-10-CM | POA: Diagnosis not present

## 2022-03-22 DIAGNOSIS — R030 Elevated blood-pressure reading, without diagnosis of hypertension: Secondary | ICD-10-CM | POA: Diagnosis not present

## 2022-03-22 DIAGNOSIS — Z23 Encounter for immunization: Secondary | ICD-10-CM | POA: Diagnosis not present

## 2022-03-22 DIAGNOSIS — M25521 Pain in right elbow: Secondary | ICD-10-CM | POA: Diagnosis not present

## 2022-03-22 LAB — POCT GLYCOSYLATED HEMOGLOBIN (HGB A1C): HbA1c, POC (controlled diabetic range): 7.1 % — AB (ref 0.0–7.0)

## 2022-03-22 MED ORDER — SHINGRIX 50 MCG/0.5ML IM SUSR: INTRAMUSCULAR | 1 refills | Status: DC

## 2022-03-22 MED ORDER — EMPAGLIFLOZIN 25 MG PO TABS: 25.0000 mg | ORAL_TABLET | Freq: Every day | ORAL | 3 refills | Status: DC

## 2022-03-22 NOTE — Patient Instructions (Addendum)
It was great to see you again today!  Checking kidneys and cholesterol today, also urine protein.  Take shingles vaccine prescription to your pharmacy   Flu shot today  Increase jardiance to '25mg'$  daily - sent in new pill for you  Follow up with orthopedic office for elbow - let me know if you decide you want me to order an xray for you  Schedule nurse blood pressure check on the way out  Follow up with me in 3 months, sooner if needed  Be well, Dr. Ardelia Mems

## 2022-03-22 NOTE — Assessment & Plan Note (Addendum)
A1c up today at 7.1.  Increase Jardiance to 25 mg daily.  Continue metformin XR 1000 mg daily.  Follow-up in 3 months for next A1c. Foot exam done today.  Urine micro obtained today as well.  Current on eye exam.

## 2022-03-22 NOTE — Assessment & Plan Note (Signed)
Update lipids today.  Continue statin.

## 2022-03-22 NOTE — Assessment & Plan Note (Addendum)
BP up some today but hard to interpret in setting of elbow pain Schedule RN blood pressure check in 1 week

## 2022-03-22 NOTE — Progress Notes (Signed)
Date of Visit: 03/22/2022   SUBJECTIVE:   HPI:  Jason Chang presents today for routine follow-up.  Diabetes: Currently taking Jardiance 10 mg daily, and metformin XR 1000 mg daily.  He is tolerating these well.  He does note he has gained some weight since his last visit.  A1c today is 7.1.  Hyperlipidemia: Currently taking atorvastatin 40 mg daily, tolerating well.  He is fasting today.  Right elbow pain: On Saturday was cutting down a tree with a family member when he was hit by a large limb on his L flank.  He did not injure his abdomen or torso but did fall backwards and land on his right elbow.  He was seen the next morning at an orthopedic urgent care in [place].  Had x-rays done which did not show any fractures at that time according to patient.  He was supposed to follow-up with the orthopedic office today, but they called to cancel his appointment as the provider was going to be out.  He reports the swelling is improving and he has better range of motion but he continues to have some pain over his right posterior elbow.  Sinus drainage: Woke up today with a little bit of drainage of the sinuses.  No fevers.  No cough.  No significant sore throat, just some irritation from having drainage.  He notes he had an injection of his left knee and is likely going to need a knee replacement at some point.  OBJECTIVE:   BP (!) 142/92   Pulse 64   Ht 6' (1.829 m)   Wt 219 lb 6.4 oz (99.5 kg)   SpO2 100%   BMI 29.76 kg/m  Gen: No acute distress, pleasant, cooperative HEENT: Normocephalic, atraumatic, no anterior cervical or supraclavicular lymphadenopathy Heart: Regular rate and rhythm, no murmur Lungs: Clear to auscultation bilaterally, normal effort Neuro: Alert, grossly nonfocal, speech normal Abdomen: soft, nontender to palpation, no masses or organomegaly Ext: 2+ DP pulses bilaterally, normal sensation on monofilament exam.  No lesions on feet.  Some bruising of right medial  ankle without tenderness or joint instability. Right elbow is mildly swollen with tenderness just lateral to the olecranon process.  Full range of extension, can flex just past 90 degrees.  No warmth or effusion appreciated.  Grip strength 5 out of 5 bilaterally.  ASSESSMENT/PLAN:   Health maintenance:  -rx given for shingles vaccine today -flu shot given today -declines COVID booster -last eye exam at [facility] [address] 12/26, normal per patient -normal sensation on monofilament foot exam today -urine microalbumin:creat ratio obtained today  Hyperlipidemia Update lipids today.  Continue statin.  Type 2 diabetes mellitus without complications (HCC) Q6P up today at 7.1.  Increase Jardiance to 25 mg daily.  Continue metformin XR 1000 mg daily.  Follow-up in 3 months for next A1c. Foot exam done today.  Urine micro obtained today as well.  Current on eye exam.  Prehypertension BP up some today but hard to interpret in setting of elbow pain Schedule RN blood pressure check in 1 week   Right elbow pain:  No obvious signs of fracture today, seems to have some soft tissue tenderness lateral to the olecranon process.  Offered to order x-ray for patient, or he can follow-up with orthopedic office as previously recommended.  He will follow-up with Ortho care, and will let me know if he decides he wants me to order the x-ray.  Sinus drainage - sounds mild, possibly allergies vs mild viral infection. Monitor.  FOLLOW UP: Follow up in 3 months for diabetes   Jason Chang, [place]

## 2022-03-23 ENCOUNTER — Encounter: Payer: Self-pay | Admitting: Family Medicine

## 2022-03-23 LAB — LIPID PANEL
Chol/HDL Ratio: 3.4 ratio (ref 0.0–5.0)
Cholesterol, Total: 145 mg/dL (ref 100–199)
HDL: 43 mg/dL (ref >39)
LDL Chol Calc (NIH): 89 mg/dL (ref 0–99)
Triglycerides: 65 mg/dL (ref 0–149)
VLDL Cholesterol Cal: 13 mg/dL (ref 5–40)

## 2022-03-23 LAB — BASIC METABOLIC PANEL
BUN/Creatinine Ratio: 15 (ref 9–20)
BUN: 15 mg/dL (ref 6–24)
CO2: 26 mmol/L (ref 20–29)
Calcium: 9.9 mg/dL (ref 8.7–10.2)
Chloride: 97 mmol/L (ref 96–106)
Creatinine, Ser: 1 mg/dL (ref 0.76–1.27)
Glucose: 107 mg/dL — ABNORMAL HIGH (ref 70–99)
Potassium: 4.5 mmol/L (ref 3.5–5.2)
Sodium: 141 mmol/L (ref 134–144)
eGFR: 87 mL/min/{1.73_m2} (ref >59)

## 2022-03-23 LAB — MICROALBUMIN / CREATININE URINE RATIO
Creatinine, Urine: 125.8 mg/dL
Microalb/Creat Ratio: 5 mg/g creat (ref 0–29)
Microalbumin, Urine: 5.8 ug/mL

## 2022-03-30 IMAGING — DX DG HAND COMPLETE 3+V*R*
3 series · 3 of 3 positions shown · non-contrast
Comparison: None.

CLINICAL DATA: 54 yo RHD Chai surveyor (uses hands) with reduced
grip strength + Pain in MCP, evaluate for OA

EXAM:
RIGHT HAND - COMPLETE 3+ VIEW

[dg hand complete right (1 of 3)]
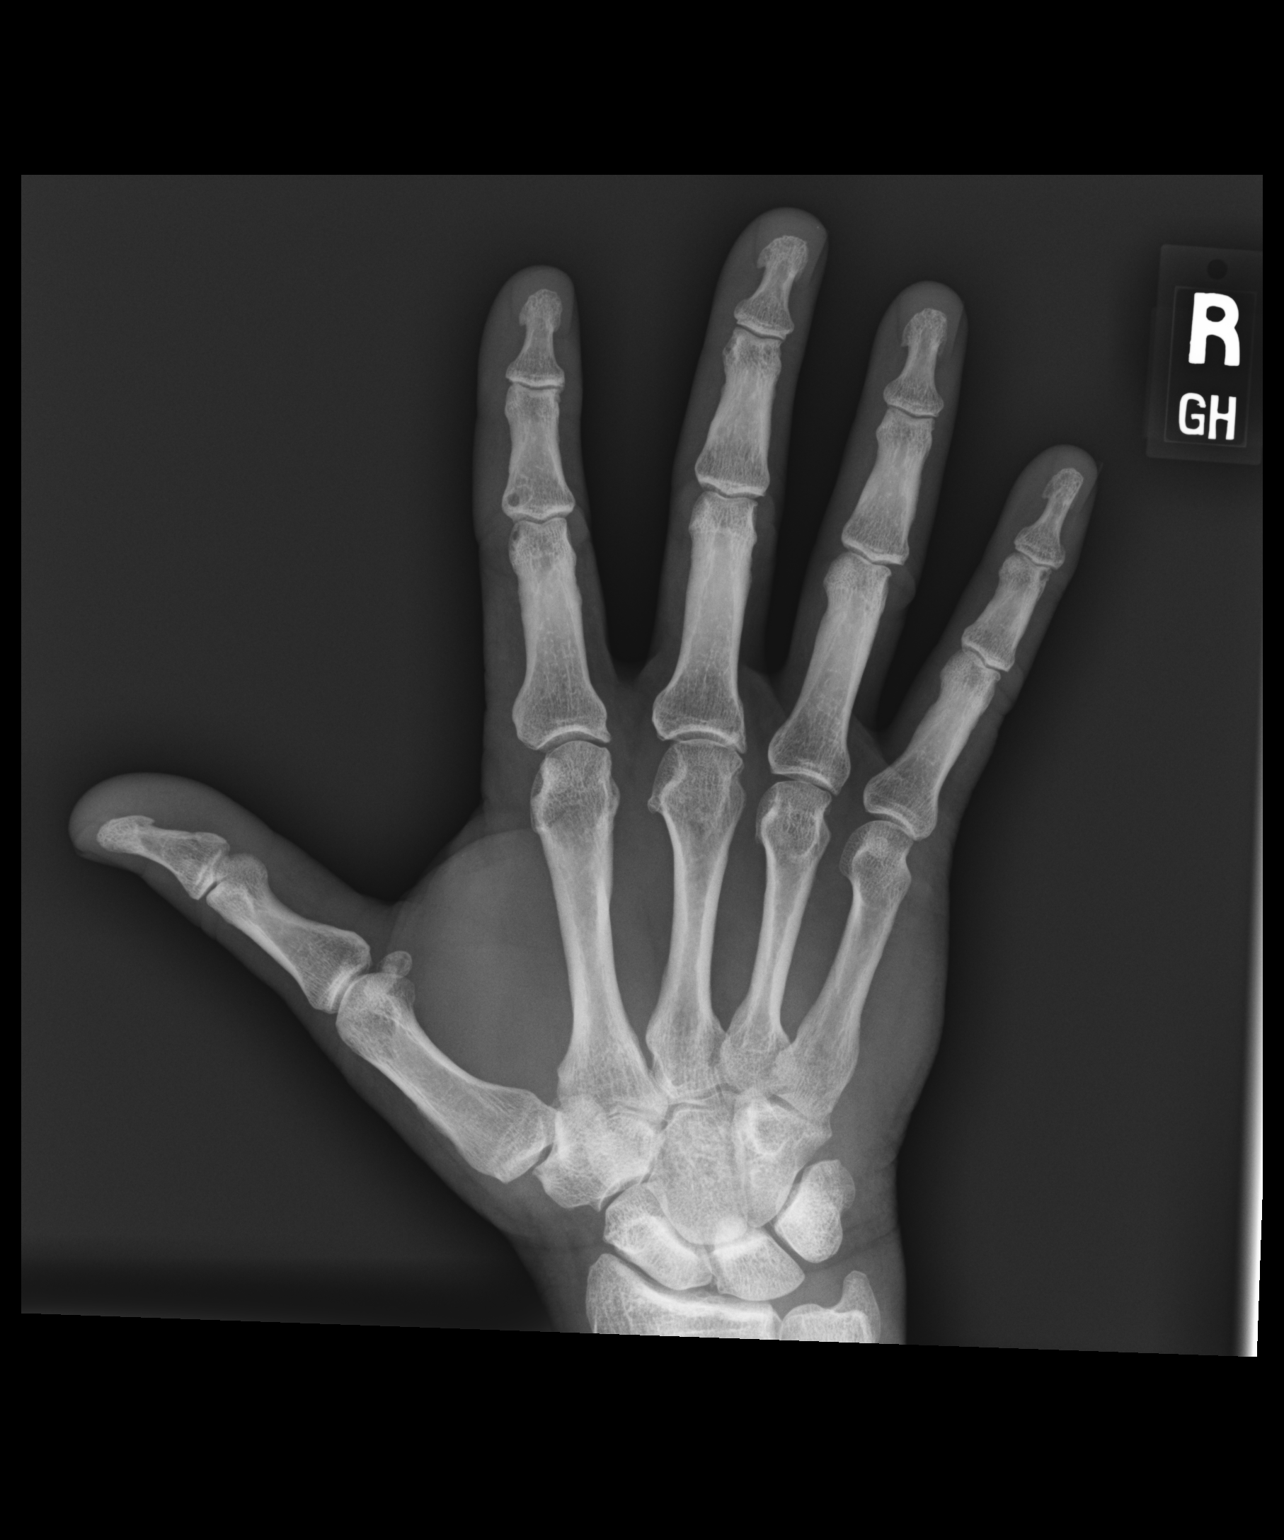

[dg hand complete right (2 of 3)]
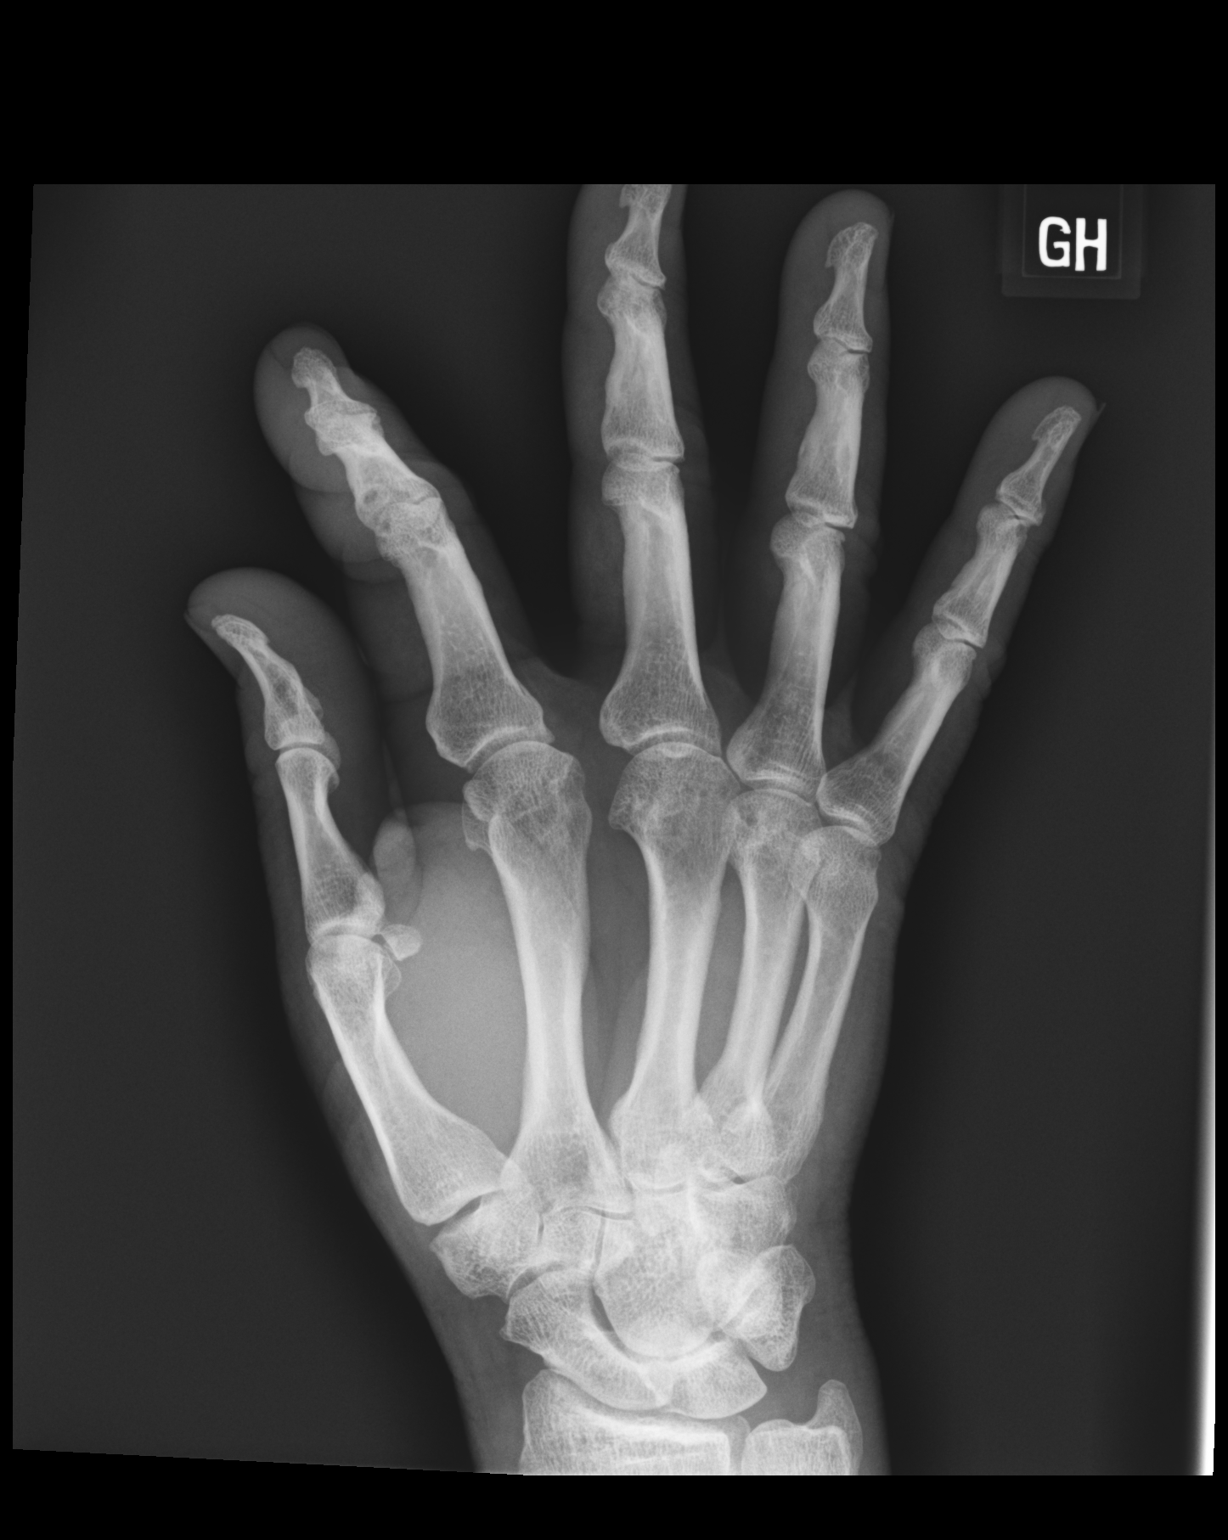

[dg hand complete right (3 of 3)]
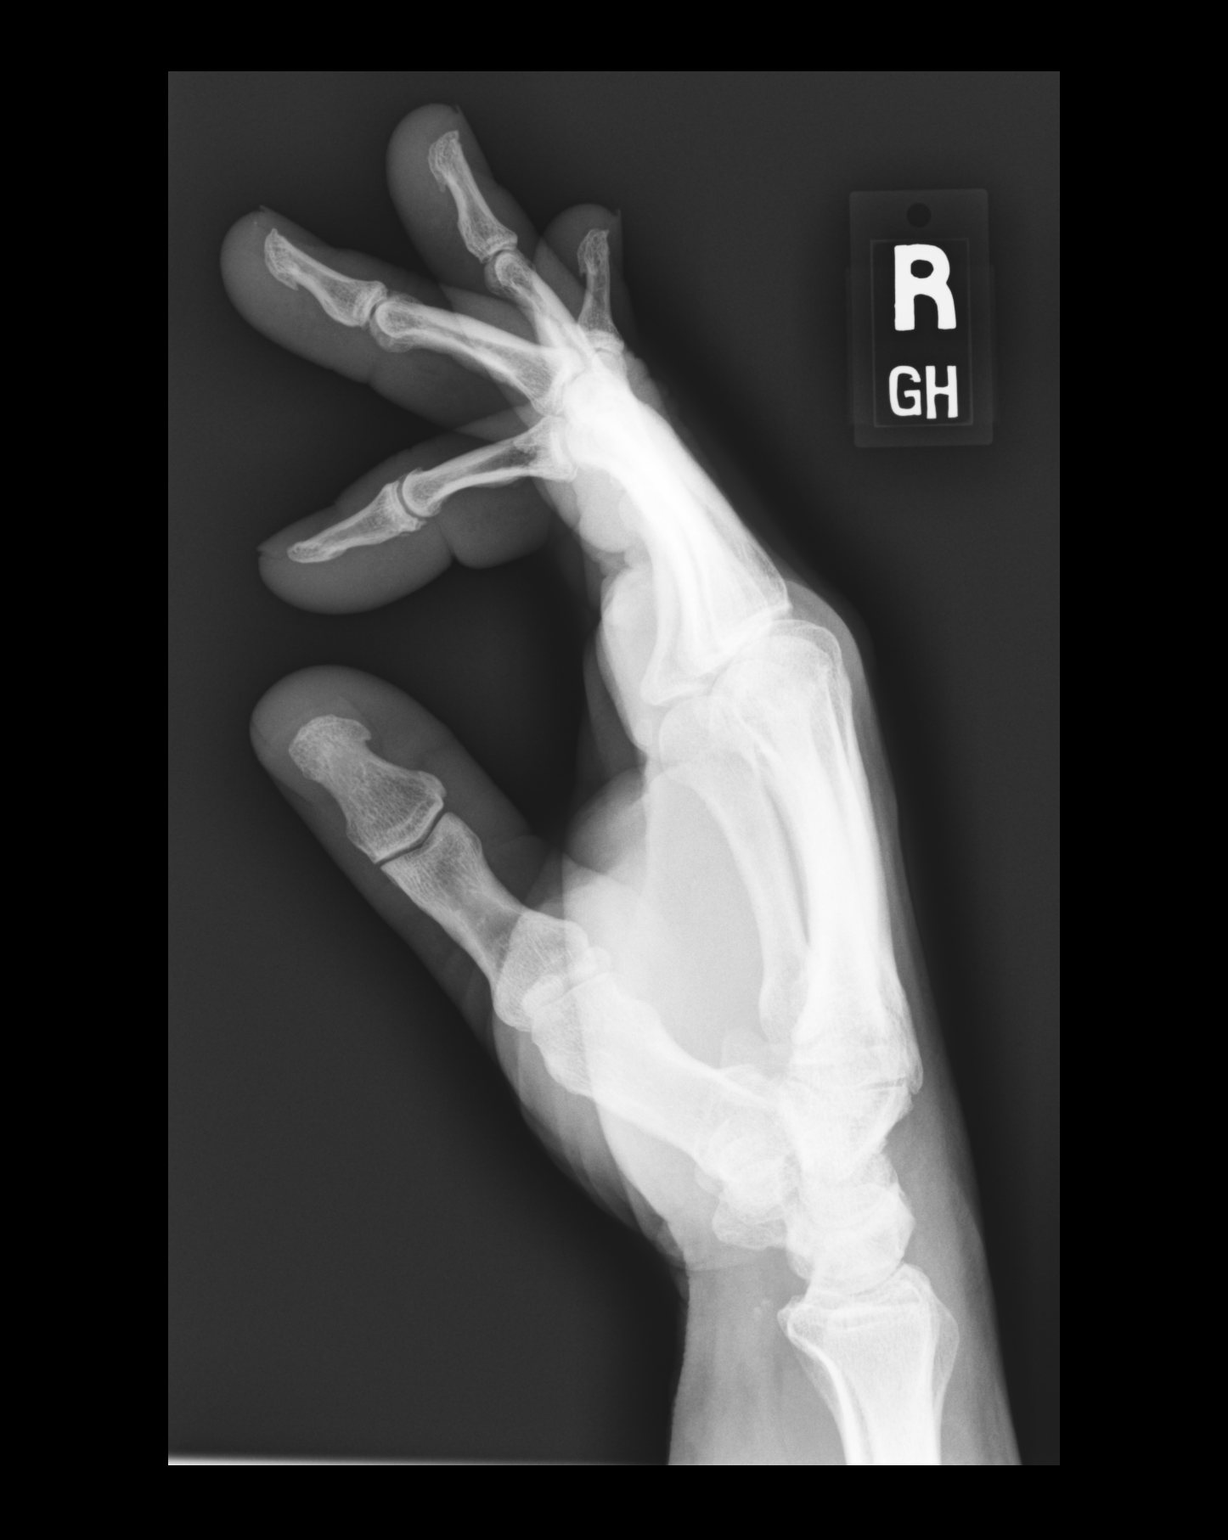

[3 of 3 positions shown; findings below may reference images not displayed]

FINDINGS: Joint space narrowing and spurring involving the third and to a
lesser extent second and fourth metacarpal phalangeal joints.
Subchondral cystic change involving the index finger proximal
interphalangeal joint. Minimal radiocarpal joint space narrowing. No
erosion or bone destruction. No fracture. Normal alignment. No focal
bone lesion. Unremarkable soft tissues.
IMPRESSION: Mild osteoarthritis of the second through fourth metacarpophalangeal
joints, index finger interphalangeal joints, and radiocarpal joint.

## 2022-04-02 ENCOUNTER — Ambulatory Visit (INDEPENDENT_AMBULATORY_CARE_PROVIDER_SITE_OTHER): Payer: BC Managed Care – PPO

## 2022-04-02 VITALS — BP 138/84 | HR 63

## 2022-04-02 DIAGNOSIS — Z0130 Encounter for examination of blood pressure without abnormal findings: Secondary | ICD-10-CM

## 2022-04-02 NOTE — Progress Notes (Signed)
Blood pressure acceptable, no changes to medications [name], MD

## 2022-04-02 NOTE — Progress Notes (Signed)
Patient presents to nurse clinic for BP check. Resting BP 138/84, HR: 63, Sp02: 99% RA.   Patient does not take BP medications and is currently asymptomatic.   Forwarding visit to PCP.   Talbot Grumbling, RN

## 2022-04-16 DIAGNOSIS — L578 Other skin changes due to chronic exposure to nonionizing radiation: Secondary | ICD-10-CM | POA: Diagnosis not present

## 2022-04-16 DIAGNOSIS — L570 Actinic keratosis: Secondary | ICD-10-CM | POA: Diagnosis not present

## 2022-04-25 ENCOUNTER — Other Ambulatory Visit: Payer: Self-pay | Admitting: Family Medicine

## 2022-07-03 DIAGNOSIS — L570 Actinic keratosis: Secondary | ICD-10-CM | POA: Diagnosis not present

## 2022-07-03 DIAGNOSIS — C44622 Squamous cell carcinoma of skin of right upper limb, including shoulder: Secondary | ICD-10-CM | POA: Diagnosis not present

## 2022-08-05 ENCOUNTER — Emergency Department (HOSPITAL_COMMUNITY)
Admission: EM | Admit: 2022-08-05 | Discharge: 2022-08-05 | Disposition: A | Discharge status: Home / Self Care | Payer: BC Managed Care – PPO | Attending: Emergency Medicine | Admitting: Emergency Medicine

## 2022-08-05 ENCOUNTER — Other Ambulatory Visit: Payer: Self-pay

## 2022-08-05 ENCOUNTER — Encounter (HOSPITAL_COMMUNITY): Payer: Self-pay

## 2022-08-05 DIAGNOSIS — W458XXA Other foreign body or object entering through skin, initial encounter: Secondary | ICD-10-CM | POA: Insufficient documentation

## 2022-08-05 DIAGNOSIS — S61442A Puncture wound with foreign body of left hand, initial encounter: Secondary | ICD-10-CM | POA: Insufficient documentation

## 2022-08-05 DIAGNOSIS — S60552A Superficial foreign body of left hand, initial encounter: Secondary | ICD-10-CM | POA: Diagnosis not present

## 2022-08-05 DIAGNOSIS — Z23 Encounter for immunization: Secondary | ICD-10-CM | POA: Insufficient documentation

## 2022-08-05 DIAGNOSIS — S6990XA Unspecified injury of unspecified wrist, hand and finger(s), initial encounter: Secondary | ICD-10-CM

## 2022-08-05 MED ORDER — AMOXICILLIN-POT CLAVULANATE 875-125 MG PO TABS
1.0000 | ORAL_TABLET | Freq: Once | ORAL | Status: AC
  Administered 2022-08-05: 1 via ORAL
  Filled 2022-08-05: qty 1

## 2022-08-05 MED ORDER — LIDOCAINE-EPINEPHRINE (PF) 2 %-1:200000 IJ SOLN
INTRAMUSCULAR | Status: AC
  Administered 2022-08-05: 20 mL
  Filled 2022-08-05: qty 20

## 2022-08-05 MED ORDER — BACITRACIN ZINC 500 UNIT/GM EX OINT
TOPICAL_OINTMENT | Freq: Two times a day (BID) | CUTANEOUS | Status: DC
  Administered 2022-08-05: 1 via TOPICAL
  Filled 2022-08-05: qty 0.9

## 2022-08-05 MED ORDER — NAPROXEN 500 MG PO TABS: 500.0000 mg | ORAL_TABLET | Freq: Two times a day (BID) | ORAL | 0 refills | Status: DC

## 2022-08-05 MED ORDER — TETANUS-DIPHTH-ACELL PERTUSSIS 5-2.5-18.5 LF-MCG/0.5 IM SUSY
0.5000 mL | PREFILLED_SYRINGE | Freq: Once | INTRAMUSCULAR | Status: AC
  Administered 2022-08-05: 0.5 mL via INTRAMUSCULAR
  Filled 2022-08-05: qty 0.5

## 2022-08-05 MED ORDER — AMOXICILLIN-POT CLAVULANATE 875-125 MG PO TABS: 1.0000 | ORAL_TABLET | Freq: Two times a day (BID) | ORAL | 0 refills | Status: DC

## 2022-08-05 MED ORDER — LIDOCAINE-EPINEPHRINE 2 %-1:100000 IJ SOLN: 20.0000 mL | Freq: Once | INTRAMUSCULAR | Status: DC

## 2022-08-05 MED ORDER — NAPROXEN 250 MG PO TABS
500.0000 mg | ORAL_TABLET | Freq: Once | ORAL | Status: AC
  Administered 2022-08-05: 500 mg via ORAL
  Filled 2022-08-05: qty 2

## 2022-08-05 NOTE — ED Notes (Signed)
ED Provider at bedside. 

## 2022-08-05 NOTE — ED Triage Notes (Signed)
Patient presents with a fishing lure caught in the top of his L hand. Patient alert and oriented, ambu; in no apparent distress

## 2022-08-05 NOTE — ED Provider Notes (Signed)
[place] EMERGENCY DEPARTMENT AT [facility] Provider Note   CSN: [id number] Arrival date & time: 08/05/22  2040     History  Chief Complaint  Patient presents with   Hand Injury    Jason Chang is a 57 y.o. male.   Hand Injury    This patient is a 57 year old male, not on any anticoagulants, presenting with a complaint of a fishhook that got caught in his left hand.  It was a triple hook, 2 of the hooks are embedded in the dorsum of the hand near the ulnar aspect of the dorsal surface of the left hand.  He is right-hand dominant.  This occurred a short time ago.  Home Medications Prior to Admission medications   Medication Sig Start Date End Date Taking? Authorizing Provider  amoxicillin-clavulanate (AUGMENTIN) 875-125 MG tablet Take 1 tablet by mouth every 12 (twelve) hours. 08/05/22  Yes Eber Hong, MD  naproxen (NAPROSYN) 500 MG tablet Take 1 tablet (500 mg total) by mouth 2 (two) times daily with a meal. 08/05/22  Yes Eber Hong, MD  aspirin EC 81 MG tablet Take 1 tablet (81 mg total) by mouth daily. 07/09/19   Latrelle Dodrill, MD  atorvastatin (LIPITOR) 40 MG tablet TAKE ONE (1) TABLET BY MOUTH EVERY DAY 04/26/22   Latrelle Dodrill, MD  empagliflozin (JARDIANCE) 25 MG TABS tablet Take 1 tablet (25 mg total) by mouth daily. 03/22/22   Latrelle Dodrill, MD  metFORMIN (GLUCOPHAGE-XR) 500 MG 24 hr tablet TAKE 2 TABLETS BY MOUTH EVERY MORNING WITH BREAKFAST 04/26/22   Latrelle Dodrill, MD  Zoster Vaccine Adjuvanted [facility]) injection Administer Shingrix vaccination now and repeat in two months 03/22/22   Latrelle Dodrill, MD      Allergies    Patient has no known allergies.    Review of Systems   Review of Systems  Skin:  Positive for wound.  Neurological:  Negative for weakness and numbness.    Physical Exam Updated Vital Signs BP (!) 174/99 (BP Location: Right Arm)   Pulse 82   Temp 97.8 F (36.6 C) (Oral)   Resp 15   Ht 1.829  m (6')   Wt 96.2 kg   SpO2 98%   BMI 28.75 kg/m  Physical Exam Vitals and nursing note reviewed.  Constitutional:      Appearance: He is well-developed. He is not diaphoretic.  HENT:     Head: Normocephalic and atraumatic.  Eyes:     General:        Right eye: No discharge.        Left eye: No discharge.     Conjunctiva/sclera: Conjunctivae normal.  Pulmonary:     Effort: Pulmonary effort is normal. No respiratory distress.  Musculoskeletal:     Comments: Tenderness around the hooks, no bleeding, minimal swelling,  Skin:    General: Skin is warm and dry.     Findings: No erythema or rash.  Neurological:     General: No focal deficit present.     Mental Status: He is alert.     Coordination: Coordination normal.     ED Results / Procedures / Treatments   Labs (all labs ordered are listed, but only abnormal results are displayed) Labs Reviewed - No data to display  EKG None  Radiology No results found.  Procedures .Foreign Body Removal  Date/Time: 08/05/2022 9:26 PM  Performed by: Eber Hong, MD Authorized by: Eber Hong, MD  Consent: Verbal consent obtained. Risks  and benefits: risks, benefits and alternatives were discussed Consent given by: patient Site marked: the operative site was marked Required items: required blood products, implants, devices, and special equipment available Patient identity confirmed: verbally with patient Time out: Immediately prior to procedure a "time out" was called to verify the correct patient, procedure, equipment, support staff and site/side marked as required. Body area: skin General location: upper extremity Location details: left hand Anesthesia: local infiltration  Anesthesia: Local Anesthetic: lidocaine 2% with epinephrine Anesthetic total: 6 mL  Sedation: Patient sedated: no  Patient restrained: no Patient cooperative: yes Localization method: visualized Removal mechanism: Large hemostats were used to  push the barbed hook through the skin at which time it was cut with wire cutters and retracted back out completely.  This was done on both hooks. Dressing: antibiotic ointment and dressing applied Tendon involvement: none Depth: subcutaneous Complexity: simple 2 objects recovered. Objects recovered: Fishhooks Post-procedure assessment: foreign body removed Patient tolerance: patient tolerated the procedure well with no immediate complications Comments:        Medications Ordered in ED Medications  lidocaine-EPINEPHrine (XYLOCAINE W/EPI) 2 %-1:100000 (with pres) injection 20 mL (20 mLs Intradermal Not Given 08/05/22 2114)  amoxicillin-clavulanate (AUGMENTIN) 875-125 MG per tablet 1 tablet (has no administration in time range)  naproxen (NAPROSYN) tablet 500 mg (has no administration in time range)  Tdap (BOOSTRIX) injection 0.5 mL (0.5 mLs Intramuscular Given 08/05/22 2111)  lidocaine-EPINEPHrine (XYLOCAINE W/EPI) 2 %-1:200000 (PF) injection (20 mLs  Given 08/05/22 2112)    ED Course/ Medical Decision Making/ A&P                             Medical Decision Making Risk Prescription drug management.   Local lidocaine, attempt foreign body removal, patient agreeable, update tetanus as needed        Final Clinical Impression(s) / ED Diagnoses Final diagnoses:  Fish hook in hand    Rx / DC Orders ED Discharge Orders          Ordered    amoxicillin-clavulanate (AUGMENTIN) 875-125 MG tablet  Every 12 hours        08/05/22 2125    naproxen (NAPROSYN) 500 MG tablet  2 times daily with meals        08/05/22 2125              Eber Hong, MD 08/05/22 2127

## 2022-08-05 NOTE — Discharge Instructions (Signed)
Please take Augmentin, 1 tablet twice a day for the next 7 days, this is used to treat infections, it treats a variety of infections including animal bites, bacterial infections, and some gastrointestinal infections.   It is related to amoxicillin so do not take this medication if you are allergic to amoxicillin or penicillin.  effects of medications such as antibiotics include diarrhea which may occur as well as potentially inactivating birth control so if you are using a birth control pill please use an alternative form of birth control for the next 2 weeks.  There is occasions where this antibiotic does not work so if you are not improving within 48 hours you will need to be reevaluated immediately by your doctor or in the emergency department if your symptoms are worsening  This medication will help prevent infection but sometimes they still get infected so please clean this with warm soap and water twice a day, understand that there will be some local swelling and bruising because of the injury but it should not extend up above the wrist it should not cause fevers and it should not cause severe pain, if any of those things happen return to the ER.  Thank you for allowing [place] to treat you in the emergency department today.  After reviewing your examination and potential testing that was done it appears that you are safe to go home.  I would like for you to follow-up with your doctor within the next several days, have them obtain your results and follow-up with them to review all of these tests.  If you should develop severe or worsening symptoms return to the emergency department immediately

## 2022-09-24 ENCOUNTER — Encounter: Payer: Self-pay | Admitting: Family Medicine

## 2022-09-24 ENCOUNTER — Ambulatory Visit (INDEPENDENT_AMBULATORY_CARE_PROVIDER_SITE_OTHER): Payer: BC Managed Care – PPO | Admitting: Family Medicine

## 2022-09-24 ENCOUNTER — Other Ambulatory Visit: Payer: Self-pay

## 2022-09-24 VITALS — BP 150/95 | HR 65 | Ht 72.0 in | Wt 218.2 lb

## 2022-09-24 DIAGNOSIS — E119 Type 2 diabetes mellitus without complications: Secondary | ICD-10-CM

## 2022-09-24 DIAGNOSIS — E1142 Type 2 diabetes mellitus with diabetic polyneuropathy: Secondary | ICD-10-CM | POA: Diagnosis not present

## 2022-09-24 LAB — POCT GLYCOSYLATED HEMOGLOBIN (HGB A1C): HbA1c, POC (controlled diabetic range): 7.2 % — AB (ref 0.0–7.0)

## 2022-09-24 MED ORDER — SEMAGLUTIDE(0.25 OR 0.5MG/DOS) 2 MG/3ML ~~LOC~~ SOPN: 0.2500 mg | PEN_INJECTOR | SUBCUTANEOUS | 2 refills | Status: DC

## 2022-09-24 MED ORDER — SCOPOLAMINE 1 MG/3DAYS TD PT72: 1.0000 | MEDICATED_PATCH | TRANSDERMAL | 0 refills | Status: DC

## 2022-09-24 NOTE — Patient Instructions (Signed)
It was great to see you again today.  To prevent motion sickness: - apply scopolamine patch behind your ear at least 4 hours before you get on the ship (ideally at least 12 hours). -After 72 hours, remove the patch and apply a new 1 -If still feeling the sick with use of 1 patch, you can apply a second patch  For diabetes: -Start Ozempic 0.25 mg once a week -If in 4 weeks you want to go up on the dose, send me a message or call us -We should see you again in 3 months to update your A1c  Wear compression hose Let me know if you want to try gabapentin for your leg pain  Be well, Dr. Pollie Meyer

## 2022-09-24 NOTE — Assessment & Plan Note (Addendum)
Patient's A1c in office today slightly higher (7.2) from last A1c check (7.1). Patient interested in starting GLP1 to assist with lowering A1c and weight loss.  - Start Ozempic 0.25 mg once a week for 4 weeks; patient informed if he would like to increase dose to 0.5 mg after 4 weeks to call or send a MyChart message  - Continue current medication regimen - Foot exam up to date - F/u in 3 months for next A1c

## 2022-09-24 NOTE — Progress Notes (Unsigned)
SUBJECTIVE:   CHIEF COMPLAINT / HPI:   Jason Chang is a 57 y/o male presenting for A1C f/u, bilateral leg swelling, bilateral foot pain and motion sickness prevention for an upcoming cruise.  Diabetic Management: Patient is currently on Jardiance 25 mg daily and metformin XL 500 mg 2 tablets every morning. Patient describes diet consisting of foods like tomato sandwiches, [employer] and grilled chicken biscuits. Patient denies any vision changes.   Bilateral Leg Swelling: Patient reports year-long bilateral leg swelling worse at the end of the workday. He states he can see a ring around his legs from his socks that lasts about 30 minutes. He mentions his left leg is worse than his right leg. 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 He denies wearing compression socks but does have some at home.   Bilateral Foot Pain: Patient describes year-long pain in both feet. He describes the pain as dull, burning and tingling. He states he wears boots during the workday and pain is better when wearing tennis shoes.   Motion Sickness Prevention: Patient reports an upcoming cruise to [place] leaving this Thursday. He requests a patch to prevent motion sickness.   PERTINENT  PMH / PSH: DM   OBJECTIVE:   BP (!) 150/95 (BP Location: Left Arm)   Pulse 65   Ht 6' (1.829 m)   Wt 218 lb 3.2 oz (99 kg)   SpO2 99%   BMI 29.59 kg/m    General: Not in acute distress, cooperative, pleasant HEENT: Normocephalic, atraumatic Resp: normal work of breathing on room air, speaks in full sentences without distress Extremities: Warm and dry, slight nonpitting edema bilaterally, 2+ dp pulses bilaterally  ASSESSMENT/PLAN:   Type 2 diabetes mellitus without complications (HCC) Patient's A1c in office today slightly higher (7.2) from last A1c check (7.1). Patient interested in starting GLP1 to assist with lowering A1c and weight loss.  - Start Ozempic 0.25 mg once a week for 4 weeks;  patient informed if he would like to increase dose to 0.5 mg after 4 weeks to call or send a MyChart message  - Continue current medication regimen - Foot exam up to date - F/u in 3 months for next A1c   Diabetic neuropathy (HCC) Symptoms consistent with diabetic neuropathy.  Trial of gabapentin offered, but patient prefers to defer at this time.  He will let us know if he changes his mind and wants to try it.   Venous stasis  Suspect venous stasis due to worsening symptoms at the end of the day after being on feet all day - Wear compression hose to decrease swelling  Motion Sickness Prevention - Start scopolamine patch (1.5 mg total) every 3 days for motion sickness prevention for upcoming cruise   Health Maintenance  - Patient reports he received tdap vaccine this past May after a fishing hook accident  - Patient reports following with dermatology for skin exams - Declines COVID vaccine and Shingrix for now  FOLLOW UP: Patient to follow up in 3 months for A1c check  Bubba Hales, Medical Student [facility]   Patient seen along with medical student Filomena Jungling. I personally evaluated this patient along with the student, and verified all aspects of the history, physical exam, and medical decision making as documented by the student. I agree with the student's documentation and have made all necessary edits.  Levert Feinstein, MD  [facility]

## 2022-09-25 DIAGNOSIS — E1140 Type 2 diabetes mellitus with diabetic neuropathy, unspecified: Secondary | ICD-10-CM | POA: Insufficient documentation

## 2022-09-25 NOTE — Assessment & Plan Note (Signed)
Symptoms consistent with diabetic neuropathy.  Trial of gabapentin offered, but patient prefers to defer at this time.  He will let us know if he changes his mind and wants to try it.

## 2022-10-08 ENCOUNTER — Telehealth: Payer: Self-pay

## 2022-10-08 ENCOUNTER — Telehealth (INDEPENDENT_AMBULATORY_CARE_PROVIDER_SITE_OTHER): Payer: BC Managed Care – PPO | Admitting: Student

## 2022-10-08 DIAGNOSIS — U071 COVID-19: Secondary | ICD-10-CM | POA: Diagnosis not present

## 2022-10-08 NOTE — Telephone Encounter (Signed)
Scheduled with Dr. Jena Gauss this afternoon at 4:10 pm.  Veronda Prude, RN

## 2022-10-08 NOTE — Telephone Encounter (Signed)
Patient calls nurse line regarding testing positive for COVID. He reports symptom onset on Thursday, however, did not test positive until yesterday.   He reports cough and sinus congestion. Denies fever, chills or body aches. Reports normal eating and drinking. Provided with supportive measures.   He asks that message be sent to provider regarding antivirals.   Please advise.   Veronda Prude, RN

## 2022-10-08 NOTE — Patient Instructions (Signed)
It was great to see you today! Thank you for choosing [facility] for your primary care. Jason Chang was seen for COVID 19.  Today we addressed: We discussed antivirals and the lack of the need for them    If you haven't already, sign up for My Chart to have easy access to your labs results, and communication with your primary care physician.  I recommend that you always bring your medications to each appointment as this makes it easy to ensure you are on the correct medications and helps [place] not miss refills when you need them. Call the clinic at [phone] if your symptoms worsen or you have any concerns.  You should return to our clinic No follow-ups on file. Please arrive 15 minutes before your appointment to ensure smooth check in process.  We appreciate your efforts in making this happen.  Thank you for allowing me to participate in your care, Alfredo Martinez, MD 10/08/2022, 1:09 PM PGY-3, [facility]

## 2022-10-08 NOTE — Telephone Encounter (Signed)
Patient needs virtual appointment to discuss antivirals. It appears Dr. Jena Gauss has an open appointment this PM. Can you offer this spot to him?  Thanks Latrelle Dodrill, MD

## 2022-10-08 NOTE — Progress Notes (Unsigned)
[facility] Telemedicine Visit  Patient consented to have virtual visit and was identified by name and date of birth. Method of visit: Telephone  Encounter participants: Patient: Jason Chang - located at home Provider: Alfredo Martinez - located at [facility]  Chief Complaint: COVID  HPI:  Tested positive for COVID, symptoms began on Thursday reporting of cough and sinus congestion. Denies fever, chills, body aches. Normal eating and drinking.   He went on a cruise recently and wife got sick, likely got the symptoms from his wife. Runny nose, cough, no nausea or vomiting.   Not currently taking any medications for COVID and not going work.   Patient feels as if he has been improving today.   ROS: per HPI  Pertinent PMHx: DM2, history of elevated blood pressures  Takes Ozempic, Metformin, Jardiance, Lipitor, bbASA   Exam:  There were no vitals taken for this visit.  Respiratory: Normal WOB  Assessment/Plan:  No problem-specific Assessment & Plan notes found for this encounter.    Time spent during visit with patient: *** minutes

## 2022-10-09 ENCOUNTER — Encounter: Payer: Self-pay | Admitting: Student

## 2022-10-09 NOTE — Assessment & Plan Note (Signed)
Doing well from a respiratory standpoint and improving. Do not feel antivirals would be beneficial. Continue with conservative management. Patient agrees.

## 2022-10-15 DIAGNOSIS — L570 Actinic keratosis: Secondary | ICD-10-CM | POA: Diagnosis not present

## 2022-10-15 DIAGNOSIS — D225 Melanocytic nevi of trunk: Secondary | ICD-10-CM | POA: Diagnosis not present

## 2022-10-15 DIAGNOSIS — L821 Other seborrheic keratosis: Secondary | ICD-10-CM | POA: Diagnosis not present

## 2022-10-15 DIAGNOSIS — L814 Other melanin hyperpigmentation: Secondary | ICD-10-CM | POA: Diagnosis not present

## 2022-10-22 NOTE — Progress Notes (Signed)
ADDENDUM: Spent 15 minutes on call with this patient.

## 2023-03-29 LAB — HM DIABETES EYE EXAM

## 2023-04-02 ENCOUNTER — Encounter: Payer: Self-pay | Admitting: Family Medicine

## 2023-04-02 ENCOUNTER — Ambulatory Visit: Payer: BC Managed Care – PPO | Admitting: Family Medicine

## 2023-04-02 VITALS — BP 159/90 | HR 66 | Ht 72.0 in | Wt 219.8 lb

## 2023-04-02 DIAGNOSIS — E119 Type 2 diabetes mellitus without complications: Secondary | ICD-10-CM | POA: Diagnosis not present

## 2023-04-02 DIAGNOSIS — I10 Essential (primary) hypertension: Secondary | ICD-10-CM | POA: Diagnosis not present

## 2023-04-02 DIAGNOSIS — Z0000 Encounter for general adult medical examination without abnormal findings: Secondary | ICD-10-CM

## 2023-04-02 DIAGNOSIS — Z23 Encounter for immunization: Secondary | ICD-10-CM

## 2023-04-02 DIAGNOSIS — Z7985 Long-term (current) use of injectable non-insulin antidiabetic drugs: Secondary | ICD-10-CM | POA: Diagnosis not present

## 2023-04-02 DIAGNOSIS — M542 Cervicalgia: Secondary | ICD-10-CM

## 2023-04-02 LAB — POCT GLYCOSYLATED HEMOGLOBIN (HGB A1C): HbA1c, POC (controlled diabetic range): 7.6 % — AB (ref 0.0–7.0)

## 2023-04-02 MED ORDER — LOSARTAN POTASSIUM 25 MG PO TABS: 25.0000 mg | ORAL_TABLET | Freq: Every day | ORAL | 0 refills | Status: DC

## 2023-04-02 MED ORDER — GABAPENTIN 100 MG PO CAPS: 100.0000 mg | ORAL_CAPSULE | Freq: Three times a day (TID) | ORAL | 3 refills | Status: DC | PRN

## 2023-04-02 MED ORDER — SEMAGLUTIDE(0.25 OR 0.5MG/DOS) 2 MG/3ML ~~LOC~~ SOPN: 0.2500 mg | PEN_INJECTOR | SUBCUTANEOUS | 2 refills | Status: DC

## 2023-04-02 NOTE — Patient Instructions (Addendum)
 It was great to see you again today.  Start losartan  25mg  daily for blood pressure  Follow up with Dr. Koval - schedule appointment with him  For neck pain see handout below, also sent in gabapentin  for you to take  Get the ozempic  from your pharmacy, one shot once per week If doing okay on it after a month let me know and we can go up to the next dose  Follow up in 3 months for next A1c  Be well, Dr. Donah   Cervical Radiculopathy  Cervical radiculopathy happens when a nerve in the neck (a cervical nerve) is pinched or bruised. This condition can happen because of an injury to the cervical spine (vertebrae) in the neck, or as part of the normal aging process. Pressure on the cervical nerves can cause pain or numbness that travels from the neck all the way down to the arm and fingers. This condition usually gets better with rest. Treatment may be needed if the condition does not improve. What are the causes? This condition may be caused by: A neck injury. A bulging (herniated) disk. Muscle spasms. Muscle tightness in the neck due to overuse. Arthritis. Breakdown or degeneration in the bones and joints of the spine (spondylosis) due to aging. Bone spurs that may develop near the cervical nerves. What are the signs or symptoms? Symptoms of this condition include: Pain. The pain may travel from the neck to the arm and hand. The pain can be severe or irritating. It may get worse when you move your neck. Numbness or tingling in your arm or hand. Weakness in the affected arm and hand, in severe cases. How is this diagnosed? This condition may be diagnosed based on your symptoms, your medical history, and a physical exam. You may also have tests, including: X-rays. CT scan. MRI. Electromyogram (EMG). Nerve conduction tests. How is this treated? In many cases, treatment is not needed for this condition. With rest, the condition usually gets better over time. If treatment is  needed, options may include: Wearing a soft neck collar (cervical collar) for short periods of time. Doing physical therapy to strengthen your neck muscles. Taking medicines. These may include NSAIDs, such as ibuprofen , or oral corticosteroids. Having spinal injections, in severe cases. Having surgery. This may be needed if other treatments do not help. Different types of surgery may be done depending on the cause of this condition. Follow these instructions at home:  Managing pain     Take over-the-counter and prescription medicines only as told by your health care provider. If directed, put ice on the affected area. To do this: If you have a soft neck collar, remove it as told by your health care provider. Put ice in a plastic bag. Place a towel between your skin and the bag. Leave the ice on for 20 minutes, 2-3 times a day. Remove the ice if your skin turns bright red. This is very important. If you cannot feel pain, heat, or cold, you have a greater risk of damage to the area. If applying ice does not help, you can try using heat. Use the heat source that your health care provider recommends, such as a moist heat pack or a heating pad. Place a towel between your skin and the heat source. Leave the heat on for 20-30 minutes. Remove the heat if your skin turns bright red. This is especially important if you are unable to feel pain, heat, or cold. You have a greater risk of  getting burned. Try a gentle neck and shoulder massage to help relieve symptoms. Activity Rest as needed. Return to your normal activities as told by your health care provider. Ask your health care provider what activities are safe for you. Do stretching and strengthening exercises as told by your health care provider or your physical therapist. You may have to avoid lifting. Ask your health care provider how much you can safely lift. General instructions Use a flat pillow when you sleep. Do not drive while wearing  a cervical collar. If you do not have a cervical collar, ask your health care provider if it is safe to drive while your neck heals. Ask your health care provider if the medicine prescribed to you requires you to avoid driving or using machinery. Do not use any products that contain nicotine or tobacco. These products include cigarettes, chewing tobacco, and vaping devices, such as e-cigarettes. If you need help quitting, ask your health care provider. Keep all follow-up visits. This is important. Contact a health care provider if: Your condition does not improve with treatment. Get help right away if: Your pain gets much worse and is not controlled with medicines. You have weakness or numbness in your hand, arm, face, or leg. You have a high fever. You have a stiff, rigid neck. You lose control of your bowels or your bladder (have incontinence). You have trouble with walking, balance, or speaking. Summary Cervical radiculopathy happens when a nerve in the neck is pinched or bruised. A nerve can get pinched from a bulging disk, arthritis, muscle spasms, or an injury to the neck. Symptoms include pain, tingling, or numbness radiating from the neck to the arm or hand. Weakness can also occur in severe cases. Treatment may include rest, wearing a cervical collar, and physical therapy. Medicines may be prescribed to help with pain. In severe cases, injections or surgery may be needed. This information is not intended to replace advice given to you by your health care provider. Make sure you discuss any questions you have with your health care provider. Document Revised: 09/08/2020 Document Reviewed: 09/08/2020 Elsevier Patient Education  2024 [employer].

## 2023-04-02 NOTE — Progress Notes (Signed)
  Date of Visit: 04/02/2023   SUBJECTIVE:   HPI:  Jason Chang presents today for routine follow up.  Diabetes - currently taking metformin  XR 1000mg  daily and jardiance  25mg  daily. Previously prescribed ozempic  0.25mg  weekly but never picked up the medication. A1c today is 7.6.   Hypertension - currently taking no medications for this. Blood pressure elevated today x2.  Hyperlipidemia - currently taking atorvastatin  40mg  daily  Neck pain - having pain in L neck radiating down L arm as numbness. Worse at night. Ongoing for about 3 months. Worse with certain neck positions.   OBJECTIVE:   BP (!) 159/90   Pulse 66   Ht 6' (1.829 m)   Wt 219 lb 12.8 oz (99.7 kg)   SpO2 100%   BMI 29.81 kg/m  Gen: no acute distress, pleasant, cooperative HEENT: normocephalic, atraumatic. Full range of motion of neck. Grip 5/5 bilaterally. Full range of motion of shoulder. Sensation intact over bilateral arms Heart: regular rate and rhythm, no murmur Lungs: clear to auscultation bilaterally, normal work of breathing  Neuro: alert, grossly nonfocal, speech normal  ASSESSMENT/PLAN:   Assessment & Plan Type 2 diabetes mellitus without complication, without long-term current use of insulin (HCC) A1c 7.6, ideally would like this lower. Discussed starting ozempic  as previously prescribed After discussion he is amenable and will pick up this rx. Resent rx in for him. Check UACR today. Getting CMET & lipids through work later today (required) Routine adult health maintenance UACR done today Current on eye exam, records requested from [facility] Flu shot given today Declines COVID booster Primary hypertension Newly diagnosing hypertension today with multiple elevated blood pressures Start losartan  25mg  daily Follow up in 1 week with Dr. Koval. Will need bmet at that visit to monitor Cr & electrolytes Neck pain With radiculopathy No weakness on exam, no red flags Discussed options including  imaging, medications, physical therapy, referral to specialist After discussion and using shared decision making we elected to do trial of low dose gabapentin  100mg  three times daily as needed for pain. Follow up if not improving    FOLLOW UP: Follow up in 3 months with me for next A1c See Dr. Koval in 1 week for blood pressure recheck & BMET draw  Jahan Friedlander J. Donah, MD [facility]

## 2023-04-03 ENCOUNTER — Other Ambulatory Visit: Payer: Self-pay | Admitting: Family Medicine

## 2023-04-04 DIAGNOSIS — M542 Cervicalgia: Secondary | ICD-10-CM | POA: Insufficient documentation

## 2023-04-04 DIAGNOSIS — Z0000 Encounter for general adult medical examination without abnormal findings: Secondary | ICD-10-CM | POA: Insufficient documentation

## 2023-04-04 DIAGNOSIS — I10 Essential (primary) hypertension: Secondary | ICD-10-CM | POA: Insufficient documentation

## 2023-04-04 LAB — MICROALBUMIN / CREATININE URINE RATIO
Creatinine, Urine: 56.8 mg/dL
Microalb/Creat Ratio: <5 mg/g{creat} (ref 0–29)
Microalbumin, Urine: <3 ug/mL

## 2023-04-04 NOTE — Assessment & Plan Note (Signed)
Newly diagnosing hypertension today with multiple elevated blood pressures Start losartan 25mg  daily Follow up in 1 week with Dr. Raymondo Band. Will need bmet at that visit to monitor Cr & electrolytes

## 2023-04-04 NOTE — Assessment & Plan Note (Signed)
 With radiculopathy No weakness on exam, no red flags Discussed options including imaging, medications, physical therapy, referral to specialist After discussion and using shared decision making we elected to do trial of low dose gabapentin  100mg  three times daily as needed for pain. Follow up if not improving

## 2023-04-04 NOTE — Assessment & Plan Note (Signed)
A1c 7.6, ideally would like this lower. Discussed starting ozempic as previously prescribed After discussion he is amenable and will pick up this rx. Resent rx in for him. Check UACR today. Getting CMET & lipids through work later today (required)

## 2023-04-04 NOTE — Assessment & Plan Note (Signed)
UACR done today Current on eye exam, records requested from [facility] Flu shot given today Declines COVID booster

## 2023-04-11 ENCOUNTER — Ambulatory Visit (INDEPENDENT_AMBULATORY_CARE_PROVIDER_SITE_OTHER): Payer: BC Managed Care – PPO | Admitting: Pharmacist

## 2023-04-11 ENCOUNTER — Encounter: Payer: Self-pay | Admitting: Pharmacist

## 2023-04-11 VITALS — BP 160/95 | HR 82 | Wt 224.0 lb

## 2023-04-11 DIAGNOSIS — I10 Essential (primary) hypertension: Secondary | ICD-10-CM

## 2023-04-11 DIAGNOSIS — E785 Hyperlipidemia, unspecified: Secondary | ICD-10-CM | POA: Diagnosis not present

## 2023-04-11 MED ORDER — ATORVASTATIN CALCIUM 80 MG PO TABS: 80.0000 mg | ORAL_TABLET | Freq: Every day | ORAL | 3 refills | Status: DC

## 2023-04-11 MED ORDER — AMLODIPINE BESYLATE 5 MG PO TABS
5.0000 mg | ORAL_TABLET | Freq: Every day | ORAL | 3 refills | Status: DC
Start: 2023-04-11 — End: 2023-08-15

## 2023-04-11 NOTE — Assessment & Plan Note (Signed)
ASCVD - in diabetic and hypertensive patient. LDL of 87 (1/14) remains above goal of < 70 mg/dL. Medication adherence appears good. -Increased atorvastatin from 40 mg to 80 mg daily.

## 2023-04-11 NOTE — Patient Instructions (Addendum)
It was nice to see you today!  Your goal blood pressure is <130/80 mmHg.  Medication Changes: START amlodipine 5 mg daily.  Increase atorvastatin from 40 mg to 80 mg daily (2 tablets until gone).  Continue all other medication the same.    Monitor blood pressure at home daily and keep a log (on your phone or piece of paper) to bring with you to your next visit. Write down date, time, blood pressure and pulse.  Keep up the good work with diet and exercise. Aim for a diet full of vegetables, fruit and lean meats (chicken, [place], fish). Try to limit salt intake by eating fresh or frozen vegetables (instead of canned), rinse canned vegetables prior to cooking and do not add any additional salt to meals.

## 2023-04-11 NOTE — Progress Notes (Signed)
S:     Chief Complaint  Patient presents with   Medication Management    BP Check + BMET   58 y.o. male who presents for hypertension evaluation, education, and management.   PMH is significant for diabetes, hypertension, hyperlipidemia.  Patient was referred and last seen by Primary Care Provider, Dr. Pollie Meyer, on 04/02/23.   At last visit, started on losartan 25 mg daily for recently diagnosed HTN and Ozempic (semaglutide) 0.25 mg weekly.   Today, patient arrives in good spirits and presents without assistance. Denies dizziness, headache, blurred vision, swelling. Patient reports sensation of stomach burning/acid reflux after stating Ozempic (semaglutide) 0.25 mg dose.  Patient reports hypertension was diagnosed in 03/2023.   Medication adherence appears good.   Current antihypertensives include: losartan 25 mg   O:  Review of Systems  Gastrointestinal:  Positive for abdominal pain (stomach burning) and heartburn.  All other systems reviewed and are negative. GI side effects thought to be related to starting Ozempic  Physical Exam Vitals reviewed.  Constitutional:      Appearance: Normal appearance.  Pulmonary:     Effort: Pulmonary effort is normal.  Neurological:     General: No focal deficit present.     Mental Status: He is alert.  Psychiatric:        Mood and Affect: Mood normal.        Behavior: Behavior normal.        Thought Content: Thought content normal.        Judgment: Judgment normal.     Labs from outside lab. [facility] (04/02/23) - prior to starting losartan: K+ 4.6, SCr 1.04, eGFR 83, LDL 87 (non-fasting)  Last 3 Office BP readings: BP Readings from Last 3 Encounters:  04/02/23 (!) 159/90  09/24/22 (!) 150/95  08/05/22 (!) 174/99    BMET    Component Value Date/Time   NA 141 03/22/2022 1109   K 4.5 03/22/2022 1109   CL 97 03/22/2022 1109   CO2 26 03/22/2022 1109   GLUCOSE 107 (H) 03/22/2022 1109   GLUCOSE 176 (H) 11/25/2019 0649    BUN 15 03/22/2022 1109   CREATININE 1.00 03/22/2022 1109   CREATININE 0.98 07/21/2015 0857   CALCIUM 9.9 03/22/2022 1109   GFRNONAA 57 (L) 11/25/2019 0649   GFRNONAA 89 07/21/2015 0857   GFRAA >60 11/25/2019 0649   GFRAA >89 07/21/2015 0857    Clinical ASCVD: Yes  The 10-year ASCVD risk score (Arnett DK, et al., 2019) is: 20.2%   Values used to calculate the score:     Age: 10 years     Sex: Male     Is Non-Hispanic African American: No     Diabetic: Yes     Tobacco smoker: No     Systolic Blood Pressure: 159 mmHg     Is BP treated: Yes     HDL Cholesterol: 43 mg/dL     Total Cholesterol: 145 mg/dL   A/P: Hypertension recently diagnosed (04/02/23) currently poorly controlled on current medication. BP goal < 130/80 mmHg. Medication adherence appears good. Control is suboptimal due to recent diagnosis and medication optimization (started on losartan a week ago).  -Started amlodipine 5 mg daily.  -Continued losartan 25 mg daily.  -Patient educated on purpose, proper use, and potential adverse effects.  -F/u labs ordered today - BMET  -Counseled on lifestyle modifications for blood pressure control including reduced dietary sodium, increased exercise, adequate sleep. -Encouraged patient to check BP at home and bring log  of readings to next visit. Counseled on proper use of home BP cuff.   ASCVD - in diabetic and hypertensive patient. LDL of 87 (1/14) remains above goal of < 70 mg/dL. Medication adherence appears good. -Increased atorvastatin from 40 mg to 80 mg daily.  Results reviewed and written information provided.    Written patient instructions provided. Patient verbalized understanding of treatment plan.  Total time in face to face counseling 33 minutes.    Follow-up:  Pharmacist 02/24 - BP follow-up. PCP clinic visit in TBD.  Patient seen with Lavona Mound, PharmD Candidate and Laqueta Jean, PharmD Candidate.

## 2023-04-11 NOTE — Assessment & Plan Note (Signed)
Hypertension recently diagnosed (04/02/23) currently poorly controlled on current medication. BP goal < 130/80 mmHg. Medication adherence appears good. Control is suboptimal due to recent diagnosis and medication optimization (started on losartan a week ago).  -Started amlodipine 5 mg daily.  -Continued losartan 25 mg daily.  -Patient educated on purpose, proper use, and potential adverse effects.  -F/u labs ordered today - BMET  -Counseled on lifestyle modifications for blood pressure control including reduced dietary sodium, increased exercise, adequate sleep. -Encouraged patient to check BP at home and bring log of readings to next visit. Counseled on proper use of home BP cuff.

## 2023-04-11 NOTE — Progress Notes (Signed)
Reviewed and agree with Dr Koval's plan.   

## 2023-04-12 LAB — BASIC METABOLIC PANEL
BUN/Creatinine Ratio: 14 (ref 9–20)
BUN: 17 mg/dL (ref 6–24)
CO2: 24 mmol/L (ref 20–29)
Calcium: 10.1 mg/dL (ref 8.7–10.2)
Chloride: 99 mmol/L (ref 96–106)
Creatinine, Ser: 1.18 mg/dL (ref 0.76–1.27)
Glucose: 178 mg/dL — ABNORMAL HIGH (ref 70–99)
Potassium: 5.3 mmol/L — ABNORMAL HIGH (ref 3.5–5.2)
Sodium: 139 mmol/L (ref 134–144)
eGFR: 71 mL/min/{1.73_m2} (ref >59)

## 2023-04-17 DIAGNOSIS — L570 Actinic keratosis: Secondary | ICD-10-CM | POA: Diagnosis not present

## 2023-04-19 ENCOUNTER — Telehealth: Payer: Self-pay | Admitting: Pharmacist

## 2023-04-19 ENCOUNTER — Encounter: Payer: Self-pay | Admitting: Pharmacist

## 2023-04-19 DIAGNOSIS — E119 Type 2 diabetes mellitus without complications: Secondary | ICD-10-CM

## 2023-04-19 NOTE — Telephone Encounter (Signed)
-----   Message from [place] sent at 04/19/2023  1:57 PM EST ----- Regarding: FW: losartan start - thoughts? I think we can bring in for repeat. He ideally should be on an ARB with diabetes and hypertension so would love to not have to stop the medication.   Thanks!  [place] ----- Message ----- From: Kathrin Ruddy, RPH-CPP Sent: 04/19/2023  12:45 PM EST To: Latrelle Dodrill, MD Subject: losartan start - thoughts?                     Dr. Pollie Meyer,   Thoughts on plan for losartan?   Bring in for repeat vs. STOP? Sorry, it has taken me all week to get to this.  I can contact if you like.  Let me know thoughts Thanks. ----- Message ----- From: McDiarmid, Leighton Roach, MD Sent: 04/12/2023  11:30 AM EST To: Kathrin Ruddy, RPH-CPP; #   ----- Message ----- From: Nell Range Lab Results In Sent: 04/12/2023   8:13 AM EST To: Leighton Roach McDiarmid, MD

## 2023-04-19 NOTE — Telephone Encounter (Signed)
Attempted to contact patient for follow-up of lab result.    Left HIPAA compliant voice mail requesting call back to direct phone: [phone]  Additionally, sent my chart message.  BMET - future order placed.   Total time with patient call and documentation of interaction: 8 minutes.

## 2023-04-22 ENCOUNTER — Telehealth: Payer: Self-pay | Admitting: Pharmacist

## 2023-04-22 NOTE — Telephone Encounter (Signed)
 Reviewed and agree with Dr Macky Lower plan.

## 2023-04-22 NOTE — Telephone Encounter (Signed)
Patient returns call sharing lab information from last week.  Patient left VM requesting call back to discuss.   Shared lab results.   Request for repeat lab this week is difficult as patient is working out of town in [place].  He will return to [facility] next Monday.   I asked him to come in at that time for repeat BMET to repeat for concern of potassium level at top of normal range.   BMET on Monday 2/10 planned.     Total time with patient call and documentation of interaction: 7 minutes.

## 2023-04-30 ENCOUNTER — Other Ambulatory Visit: Payer: BC Managed Care – PPO

## 2023-04-30 DIAGNOSIS — E119 Type 2 diabetes mellitus without complications: Secondary | ICD-10-CM | POA: Diagnosis not present

## 2023-05-01 LAB — BASIC METABOLIC PANEL
BUN/Creatinine Ratio: 19 (ref 9–20)
BUN: 20 mg/dL (ref 6–24)
CO2: 24 mmol/L (ref 20–29)
Calcium: 9.6 mg/dL (ref 8.7–10.2)
Chloride: 101 mmol/L (ref 96–106)
Creatinine, Ser: 1.06 mg/dL (ref 0.76–1.27)
Glucose: 87 mg/dL (ref 70–99)
Potassium: 4.6 mmol/L (ref 3.5–5.2)
Sodium: 141 mmol/L (ref 134–144)
eGFR: 81 mL/min/{1.73_m2} (ref >59)

## 2023-05-06 ENCOUNTER — Telehealth: Payer: Self-pay | Admitting: Pharmacist

## 2023-05-06 NOTE — Telephone Encounter (Signed)
Patient contacted for follow-up of lab test repeat  BMET Lab repeated 04/30/2023 reveals  Potassium normal at 4.6 Creatinine 1.06 Both improved post starting BMET after Losartan initiation.    Medication Plan: -Continue amlodipine and losartan.     BP visit in 1 week - pharmacist clinic.   Total time with patient call and documentation of interaction: 7 minutes.

## 2023-05-07 NOTE — Telephone Encounter (Signed)
 Reviewed and agree with Dr Macky Lower plan.

## 2023-05-13 ENCOUNTER — Ambulatory Visit (INDEPENDENT_AMBULATORY_CARE_PROVIDER_SITE_OTHER): Payer: BC Managed Care – PPO | Admitting: Pharmacist

## 2023-05-13 ENCOUNTER — Encounter: Payer: Self-pay | Admitting: Pharmacist

## 2023-05-13 VITALS — BP 146/85 | HR 75 | Wt 214.6 lb

## 2023-05-13 DIAGNOSIS — I10 Essential (primary) hypertension: Secondary | ICD-10-CM

## 2023-05-13 MED ORDER — HYDROCHLOROTHIAZIDE 12.5 MG PO TABS: 12.5000 mg | ORAL_TABLET | Freq: Every day | ORAL | 1 refills | Status: DC

## 2023-05-13 NOTE — Patient Instructions (Signed)
 It was nice to see you today!  Your goal blood pressure is <130/80 mmHg.  Medication Changes: START hydrochlorothiazide 12.5 mg daily   Continue all other medication the same.    Monitor blood pressure at home daily and keep a log (on your phone or piece of paper) to bring with you to your next visit. Write down date, time, blood pressure and pulse.  Keep up the good work with diet and exercise. Aim for a diet full of vegetables, fruit and lean meats (chicken, [place], fish). Try to limit salt intake by eating fresh or frozen vegetables (instead of canned), rinse canned vegetables prior to cooking and do not add any additional salt to meals.

## 2023-05-13 NOTE — Assessment & Plan Note (Signed)
 Hypertension diagnosed in 03/2023 currently uncontrolled on current medications. BP goal < 130/80 mmHg. Medication adherence appears good. Control is suboptimal due to BP above 140/90 mmHg.  -Continued amlodipine 5 mg and losartan 25 mg.  -Started hydrochlorothiazide 12.5 mg.  -Patient educated on purpose, proper use, and potential adverse effects of HCTZ.  -F/u labs ordered - BMET at next office visit -Counseled on lifestyle modifications for blood pressure control including reduced dietary sodium, increased exercise, adequate sleep. -Encouraged patient to check BP at home and bring log of readings to next visit. Counseled on proper use of home BP cuff.

## 2023-05-13 NOTE — Progress Notes (Signed)
 S:     Chief Complaint  Patient presents with   Medication Management    HTN management    58 y.o. male who presents for hypertension evaluation, education, and management.  PMH is significant for DM, HTN, HLD Patient was last seen by Primary Care Provider, Dr. Pollie Meyer, on 04/02/23.   At last visit, started on losartan (Cozaar) 25 mg daily.   Today, patient arrives in good spirits and presents without assistance. Denies dizziness, headache, blurred vision, swelling.   Patient reports hypertension was diagnosed in 03/2023.    Medication adherence good . Patient has not taken BP medications today.   Current antihypertensives include: amlodipine (Norvasc) 5 mg daily, losartan (Cozaar) 25 mg daily  Reported home BP readings: 130s-140s / 80s   O:  Review of Systems  All other systems reviewed and are negative.   Physical Exam Constitutional:      Appearance: Normal appearance.  Pulmonary:     Effort: Pulmonary effort is normal.  Musculoskeletal:     Right lower leg: Edema (trace) present.     Left lower leg: Edema (trace) present.  Neurological:     Mental Status: He is alert.  Psychiatric:        Mood and Affect: Mood normal.        Behavior: Behavior normal.        Thought Content: Thought content normal.     Last 3 Office BP readings: BP Readings from Last 3 Encounters:  05/13/23 (!) 146/85  04/11/23 (!) 160/95  04/02/23 (!) 159/90    BMET    Component Value Date/Time   NA 141 04/30/2023 0854   K 4.6 04/30/2023 0854   CL 101 04/30/2023 0854   CO2 24 04/30/2023 0854   GLUCOSE 87 04/30/2023 0854   GLUCOSE 176 (H) 11/25/2019 0649   BUN 20 04/30/2023 0854   CREATININE 1.06 04/30/2023 0854   CREATININE 0.98 07/21/2015 0857   CALCIUM 9.6 04/30/2023 0854   GFRNONAA 57 (L) 11/25/2019 0649   GFRNONAA 89 07/21/2015 0857   GFRAA >60 11/25/2019 0649   GFRAA >89 07/21/2015 0857    Renal function: Estimated Creatinine Clearance: 90.7 mL/min (by C-G  formula based on SCr of 1.06 mg/dL).  Clinical ASCVD: Yes  The 10-year ASCVD risk score (Arnett DK, et al., 2019) is: 17.6%   Values used to calculate the score:     Age: 74 years     Sex: Male     Is Non-Hispanic African American: No     Diabetic: Yes     Tobacco smoker: No     Systolic Blood Pressure: 146 mmHg     Is BP treated: Yes     HDL Cholesterol: 43 mg/dL     Total Cholesterol: 145 mg/dL   A/P: Hypertension diagnosed in 03/2023 currently uncontrolled on current medications. BP goal < 130/80 mmHg. Medication adherence appears good. Control is suboptimal due to BP above 140/90 mmHg.  -Continued amlodipine 5 mg and losartan 25 mg.  -Started hydrochlorothiazide 12.5 mg.  -Patient educated on purpose, proper use, and potential adverse effects of HCTZ.  -F/u labs ordered - BMET at next office visit -Counseled on lifestyle modifications for blood pressure control including reduced dietary sodium, increased exercise, adequate sleep. -Encouraged patient to check BP at home and bring log of readings to next visit. Counseled on proper use of home BP cuff.   Results reviewed and written information provided.    Written patient instructions provided. Patient verbalized understanding of  treatment plan.  Total time in face to face counseling 26 minutes.    Follow-up:  Pharmacist 05/27/23. PCP clinic visit in 07/15/23.  Patient seen with Lavona Mound, PharmD Candidate and Pearletha Forge, PharmD Candidate.

## 2023-05-14 NOTE — Progress Notes (Signed)
 Reviewed and agree with Dr Macky Lower plan.

## 2023-05-20 DIAGNOSIS — Z6828 Body mass index (BMI) 28.0-28.9, adult: Secondary | ICD-10-CM | POA: Diagnosis not present

## 2023-05-20 DIAGNOSIS — R059 Cough, unspecified: Secondary | ICD-10-CM | POA: Diagnosis not present

## 2023-05-20 DIAGNOSIS — R0981 Nasal congestion: Secondary | ICD-10-CM | POA: Diagnosis not present

## 2023-05-20 DIAGNOSIS — R030 Elevated blood-pressure reading, without diagnosis of hypertension: Secondary | ICD-10-CM | POA: Diagnosis not present

## 2023-05-24 ENCOUNTER — Telehealth: Payer: Self-pay

## 2023-05-24 NOTE — Telephone Encounter (Signed)
 Pharmacy Patient Advocate Encounter  Received notification from [facility] that Prior Authorization for [facility] 0.25/0.5MG  has been APPROVED from 05/24/23 to 05/24/26

## 2023-05-24 NOTE — Telephone Encounter (Signed)
 Pharmacy Patient Advocate Encounter   Received notification from CoverMyMeds that prior authorization for Ozempic 0.25/0.5mg  is required/requested.   Insurance verification completed.   The patient is insured through [facility] .   PA required; PA submitted to above mentioned insurance via CoverMyMeds Key/confirmation #/EOC [id number]. Status is pending

## 2023-05-27 ENCOUNTER — Encounter: Payer: Self-pay | Admitting: Pharmacist

## 2023-05-27 ENCOUNTER — Ambulatory Visit (INDEPENDENT_AMBULATORY_CARE_PROVIDER_SITE_OTHER): Payer: BC Managed Care – PPO | Admitting: Pharmacist

## 2023-05-27 VITALS — BP 149/82 | HR 77 | Wt 210.4 lb

## 2023-05-27 DIAGNOSIS — I10 Essential (primary) hypertension: Secondary | ICD-10-CM

## 2023-05-27 MED ORDER — OLMESARTAN-AMLODIPINE-HCTZ 20-5-12.5 MG PO TABS: 1.0000 | ORAL_TABLET | Freq: Every day | ORAL | 1 refills | Status: DC

## 2023-05-27 NOTE — Progress Notes (Signed)
 S:     Chief Complaint  Patient presents with   Medication Management    Blood pressure follow up and diabetes management   58 y.o. male who presents for hypertension evaluation, education, and management.  PMH is significant for HTN, HLD, T2DM .  Patient was last seen by me on 05/13/2023  At last visit, hydrochlorothiazide 12.5 mg daily was initiated.   Today, patient arrives in good spirits and presents without assistance. Denies dizziness, headache, blurred vision, swelling.   Patient reports hypertension was diagnosed in 03/2023.   Family/Social history:  Medication adherence reports adherence . Patient has taken BP medications today.   Current antihypertensives include: amlodipine 5 mg daily, hydrochlorothiazide 12.5 mg daily and losartan 25 mg daily.   Reported home BP readings: 126-130 SBP   ASCVD risk factors include: T2DM, HTN, HLD  O:  Review of Systems  Respiratory:  Positive for cough.   Musculoskeletal:  Negative for joint pain.  All other systems reviewed and are negative.   Physical Exam Neurological:     Mental Status: He is alert.  Psychiatric:        Mood and Affect: Mood normal.        Thought Content: Thought content normal.        Judgment: Judgment normal.     Last 3 Office BP readings: BP Readings from Last 3 Encounters:  05/27/23 (!) 149/82  05/13/23 (!) 146/85  04/11/23 (!) 160/95    BMET    Component Value Date/Time   NA 141 04/30/2023 0854   K 4.6 04/30/2023 0854   CL 101 04/30/2023 0854   CO2 24 04/30/2023 0854   GLUCOSE 87 04/30/2023 0854   GLUCOSE 176 (H) 11/25/2019 0649   BUN 20 04/30/2023 0854   CREATININE 1.06 04/30/2023 0854   CREATININE 0.98 07/21/2015 0857   CALCIUM 9.6 04/30/2023 0854   GFRNONAA 57 (L) 11/25/2019 0649   GFRNONAA 89 07/21/2015 0857   GFRAA >60 11/25/2019 0649   GFRAA >89 07/21/2015 0857     Clinical ASCVD: Yes  The 10-year ASCVD risk score (Arnett DK, et al., 2019) is: 18.2%   Values  used to calculate the score:     Age: 10 years     Sex: Male     Is Non-Hispanic African American: No     Diabetic: Yes     Tobacco smoker: No     Systolic Blood Pressure: 149 mmHg     Is BP treated: Yes     HDL Cholesterol: 43 mg/dL     Total Cholesterol: 145 mg/dL  Patient is participating in a Managed Medicaid Plan:  No    A/P: Hypertension diagnosed 03/2023 on hydrochlorothiazide 12.5 mg daily, losartan 25 mg daily and amlodipine 5 mg daily. BP goal  < 130/80 mmHg. Medication adherence appears adherent and reports home readings near goal (reported systolic < 130 on multiple checks).  In-office control is suboptimal due to possible white coat HTN.  -Continued current regimen until losartan supply runs out (early-mid April).  -Started olmesartan/amlodpine/hydrochlorothiazide 20-5-12.5 mg daily after supply of losartan 25 mg runs out in early-mid April.  -Counseled on lifestyle modifications for blood pressure control including reduced dietary sodium, increased exercise, adequate sleep. -Encouraged patient to check BP at home and bring log of readings to next visit.   Diabetes - Ozempic (semaglutide) PA was recently approved.  Informed patient AND contacted pharmacy to share update.  The pharmacy will prepare for pick-up later this week.   -  Restart 0.25mg  once weekly.   Results reviewed and written information provided.    Written patient instructions provided. Patient verbalized understanding of treatment plan.  Total time in face to face counseling 16 minutes.    Follow-up:   PCP clinic visit in 07/15/23 with Dr. Clifton Custard.  Patient seen with Threasa Heads, PharmD Candidate and Mack Guise, PharmD Candidate.

## 2023-05-27 NOTE — Progress Notes (Signed)
 Reviewed and agree with Dr Macky Lower plan.

## 2023-05-27 NOTE — Assessment & Plan Note (Signed)
 Hypertension diagnosed 03/2023 on hydrochlorothiazide 12.5 mg daily, losartan 25 mg daily and amlodipine 5 mg daily. BP goal  < 130/80 mmHg. Medication adherence appears adherent and reports home readings near goal (reported systolic < 130 on multiple checks).  In-office control is suboptimal due to possible white coat HTN.  -Continued current regimen until losartan supply runs out (early-mid April).  -Started olmesartan/amlodpine/hydrochlorothiazide 20-5-12.5 mg daily after supply of losartan 25 mg runs out in early-mid April.  -Counseled on lifestyle modifications for blood pressure control including reduced dietary sodium, increased exercise, adequate sleep.

## 2023-05-27 NOTE — Patient Instructions (Addendum)
 It was nice to see you today! Continue your good work.  Your goal blood pressure is <130/80.  Medication Changes: Continue current regimen until current supply runs out  in middle of April then we will transition you to a combination pill with amlodipine/hydrochlorothiazide/olmesartan or valsartan (1 pill once a day)  Restart Ozempic 0.25 mg weekly   Continue all other medication the same.    Monitor blood pressure at home daily and keep a log (on your phone or piece of paper) to bring with you to your next visit. Write down date, time, blood pressure and pulse.  Keep up the good work with diet and exercise. Aim for a diet full of vegetables, fruit and lean meats (chicken, [place], fish). Try to limit salt intake by eating fresh or frozen vegetables (instead of canned), rinse canned vegetables prior to cooking and do not add any additional salt to meals.

## 2023-07-15 ENCOUNTER — Encounter: Payer: Self-pay | Admitting: Family Medicine

## 2023-07-15 ENCOUNTER — Ambulatory Visit: Payer: BC Managed Care – PPO | Admitting: Family Medicine

## 2023-07-15 VITALS — BP 137/86 | HR 68 | Ht 72.0 in | Wt 208.4 lb

## 2023-07-15 DIAGNOSIS — Z7985 Long-term (current) use of injectable non-insulin antidiabetic drugs: Secondary | ICD-10-CM

## 2023-07-15 DIAGNOSIS — E785 Hyperlipidemia, unspecified: Secondary | ICD-10-CM

## 2023-07-15 DIAGNOSIS — I10 Essential (primary) hypertension: Secondary | ICD-10-CM | POA: Diagnosis not present

## 2023-07-15 DIAGNOSIS — E119 Type 2 diabetes mellitus without complications: Secondary | ICD-10-CM | POA: Diagnosis not present

## 2023-07-15 DIAGNOSIS — Z0000 Encounter for general adult medical examination without abnormal findings: Secondary | ICD-10-CM | POA: Diagnosis not present

## 2023-07-15 LAB — POCT GLYCOSYLATED HEMOGLOBIN (HGB A1C): HbA1c, POC (controlled diabetic range): 6.4 % (ref 0.0–7.0)

## 2023-07-15 MED ORDER — METFORMIN HCL ER 500 MG PO TB24: 1000.0000 mg | ORAL_TABLET | Freq: Every day | ORAL | 3 refills | Status: AC

## 2023-07-15 MED ORDER — SHINGRIX 50 MCG/0.5ML IM SUSR: INTRAMUSCULAR | 1 refills | Status: AC

## 2023-07-15 MED ORDER — SEMAGLUTIDE(0.25 OR 0.5MG/DOS) 2 MG/3ML ~~LOC~~ SOPN: 0.2500 mg | PEN_INJECTOR | SUBCUTANEOUS | 2 refills | Status: DC

## 2023-07-15 NOTE — Progress Notes (Signed)
  Date of Visit: 07/15/2023   SUBJECTIVE:   HPI:  Jason "Geralyn Knee" presents today for routine follow-up.  Diabetes: Currently taking Jardiance  25 mg daily, metformin  XR 1000 mg daily, and Ozempic  0.25 mg once a week.  Tolerating these medications well.  Jason Chang is on his second dose of Ozempic .  Had some nausea with the first 1, but tolerated the second dose better.  Desires to continue at this dose.  Has been working on healthy eating, weight is down since earlier this year.  Hypertension: Currently taking amlodipine  5 mg daily, HCTZ 12.5 mg daily, and losartan  25 mg daily.  Was prescribed a combination pill consisting of olmesartan -amlodipine -HCTZ 20-5-12.5 mg daily, but has not yet started this new medication as Jason Chang was waiting to run out of his old prescriptions.  Hyperlipidemia: Currently taking Lipitor 80 mg daily.  Fasting today, amenable to lab draw.   OBJECTIVE:   BP 137/86   Pulse 68   Ht 6' (1.829 m)   Wt 208 lb 6.4 oz (94.5 kg)   SpO2 100%   BMI 28.26 kg/m  Gen: No acute distress, pleasant, cooperative, well-appearing HEENT: Normocephalic, atraumatic Heart: Regular rate and rhythm, no murmur Lungs: Clear bilaterally, normal effort Neuro: Grossly nonfocal, speech normal Ext: No edema bilateral lower extremities  ASSESSMENT/PLAN:   Assessment & Plan Type 2 diabetes mellitus without complication, without long-term current use of insulin (HCC) A1c much improved at 6.4 today.  Continue present doses of Ozempic , Jardiance , metformin .  Update renal function today.  Recheck A1c in 6 months. Routine adult health maintenance Reminded to get shingles vaccine, given new prescription for this. Declines COVID booster. Hyperlipidemia, unspecified hyperlipidemia type Check lipids today, continue statin. Primary hypertension Well-controlled on present regimen.  Jason Chang will switch to the combination pill soon.  Update renal function today.    FOLLOW UP: Follow up in 6 months for above  issues  [place] J. Dawn Eth, MD [facility]

## 2023-07-15 NOTE — Patient Instructions (Addendum)
 It was great to see you again today.  Stay on current medications Refilled metformin  and ozempic  Take shingles vaccine prescription to your pharmacy  Checking cholesterol and kidney function today  Follow up with me in 6 months, sooner if needed  Be well, Dr. Dawn Eth

## 2023-07-15 NOTE — Assessment & Plan Note (Signed)
 A1c much improved at 6.4 today.  Continue present doses of Ozempic , Jardiance , metformin .  Update renal function today.  Recheck A1c in 6 months.

## 2023-07-15 NOTE — Assessment & Plan Note (Signed)
Check lipids today, continue statin

## 2023-07-15 NOTE — Assessment & Plan Note (Signed)
 Reminded to get shingles vaccine, given new prescription for this. Declines COVID booster.

## 2023-07-15 NOTE — Assessment & Plan Note (Signed)
 Well-controlled on present regimen.  He will switch to the combination pill soon.  Update renal function today.

## 2023-07-16 ENCOUNTER — Encounter: Payer: Self-pay | Admitting: Family Medicine

## 2023-07-16 LAB — LIPID PANEL
Chol/HDL Ratio: 3.4 ratio (ref 0.0–5.0)
Cholesterol, Total: 133 mg/dL (ref 100–199)
HDL: 39 mg/dL — ABNORMAL LOW (ref >39)
LDL Chol Calc (NIH): 81 mg/dL (ref 0–99)
Triglycerides: 61 mg/dL (ref 0–149)
VLDL Cholesterol Cal: 13 mg/dL (ref 5–40)

## 2023-07-16 LAB — BASIC METABOLIC PANEL WITH GFR
BUN/Creatinine Ratio: 14 (ref 9–20)
BUN: 14 mg/dL (ref 6–24)
CO2: 24 mmol/L (ref 20–29)
Calcium: 10.2 mg/dL (ref 8.7–10.2)
Chloride: 101 mmol/L (ref 96–106)
Creatinine, Ser: 1 mg/dL (ref 0.76–1.27)
Glucose: 103 mg/dL — ABNORMAL HIGH (ref 70–99)
Potassium: 5.1 mmol/L (ref 3.5–5.2)
Sodium: 140 mmol/L (ref 134–144)
eGFR: 87 mL/min/{1.73_m2} (ref >59)

## 2023-07-17 DIAGNOSIS — M25512 Pain in left shoulder: Secondary | ICD-10-CM | POA: Diagnosis not present

## 2023-07-17 DIAGNOSIS — M542 Cervicalgia: Secondary | ICD-10-CM | POA: Diagnosis not present

## 2023-08-06 DIAGNOSIS — M5412 Radiculopathy, cervical region: Secondary | ICD-10-CM | POA: Diagnosis not present

## 2023-08-07 ENCOUNTER — Encounter: Payer: Self-pay | Admitting: Family Medicine

## 2023-08-08 NOTE — Telephone Encounter (Signed)
 Please contact patient to schedule appointment.  Thank you! Candee Cha, MD

## 2023-08-15 ENCOUNTER — Ambulatory Visit (INDEPENDENT_AMBULATORY_CARE_PROVIDER_SITE_OTHER): Admitting: Family Medicine

## 2023-08-15 ENCOUNTER — Encounter: Payer: Self-pay | Admitting: Family Medicine

## 2023-08-15 VITALS — BP 135/83 | HR 83 | Ht 72.0 in | Wt 207.0 lb

## 2023-08-15 DIAGNOSIS — R42 Dizziness and giddiness: Secondary | ICD-10-CM | POA: Diagnosis not present

## 2023-08-15 DIAGNOSIS — K625 Hemorrhage of anus and rectum: Secondary | ICD-10-CM | POA: Diagnosis not present

## 2023-08-15 NOTE — Progress Notes (Signed)
  Date of Visit: 08/15/2023   SUBJECTIVE:   HPI:  Jason Chang "Jason Chang" presents today for dizziness and hemorrhoid.  Hemorrhoid - had some bleeding on toilet paper when he wiped last week, was more blood but less now, now just faintly red. Thinks there is a hemorrhoid there. Last colonoscopy 2017 without any abnormal findings, follow up was to be in 10 years.  Has stopped taking his aspirin  since this began happening (was on for primary prevention only).  Dizziness - has had feeling of lightheadedness several times at work over last few days. Occurs especially after bending over and going to stand back up. Of note he is on jardiance  25mg  daily, omelsartan-amlodipine -hydrochlorothiazide  20-5-12.5mg  daily. Thinks he drinks enough water. No chest pain or shortness of breath, no palpitations.   OBJECTIVE:   BP 135/83   Pulse 83   Ht 6' (1.829 m)   Wt 207 lb (93.9 kg)   SpO2 99%   BMI 28.07 kg/m  Gen: no acute distress, pleasant, cooperative, well appearing HEENT: normocephalic, atraumatic  Heart: regular rate and rhythm, no murmur Lungs: clear to auscultation bilaterally, normal work of breathing  Neuro: alert grossly nonfocal speech normal Ext: no edema  Orthostatic vitals:  Lying 133/83, pulse 77 Sitting 128/89, pulse 84 Standing 130/87, pulse 93  ASSESSMENT/PLAN:   Assessment & Plan Rectal bleeding Symptoms consistent with hemorrhoid, though with no recent endoscopic evaluation likely warrants sooner colonoscopy. Discussed option of doing rectal exam/anoscopy today but we elected to defer this today since he is going to see GI for sooner colonoscopy and bleeding is only faint at this time. Check CBC and iron studies.  Lightheadedness Etiology unclear by history and exam. Initially thought it sounded orthostatic, though orthostatic vital signs are completely normal today. Possibly some element of transient orthostasis - notably he is on SGLT2 and three different blood pressure  medications. Suggested he stay hydrated and drink plenty of water, and see if this makes a difference. Checking CBC given recent rectal bleeding, though volume of blood loss described does not seem enough to cause lightheadedness. Advised to follow up of not improving.   [place] J. Dawn Eth, MD [facility]

## 2023-08-15 NOTE — Patient Instructions (Signed)
 It was great to see you again today.  Drink plenty of fluids to stay hydrated Checking blood counts and iron levels today Referring back to GI doctor - may benefit from colonoscopy sooner  Let me know if the dizziness gets worse.  Be well, Dr. Dawn Eth

## 2023-08-16 ENCOUNTER — Ambulatory Visit: Payer: Self-pay | Admitting: Family Medicine

## 2023-08-16 DIAGNOSIS — M5412 Radiculopathy, cervical region: Secondary | ICD-10-CM | POA: Diagnosis not present

## 2023-08-16 LAB — CBC
Hematocrit: 52.1 % — ABNORMAL HIGH (ref 37.5–51.0)
Hemoglobin: 17.3 g/dL (ref 13.0–17.7)
MCH: 30.5 pg (ref 26.6–33.0)
MCHC: 33.2 g/dL (ref 31.5–35.7)
MCV: 92 fL (ref 79–97)
Platelets: 219 10*3/uL (ref 150–450)
RBC: 5.67 x10E6/uL (ref 4.14–5.80)
RDW: 12.8 % (ref 11.6–15.4)
WBC: 6.3 10*3/uL (ref 3.4–10.8)

## 2023-08-16 LAB — IRON,TIBC AND FERRITIN PANEL
Ferritin: 361 ng/mL (ref 30–400)
Iron Saturation: 47 % (ref 15–55)
Iron: 138 ug/dL (ref 38–169)
Total Iron Binding Capacity: 292 ug/dL (ref 250–450)
UIBC: 154 ug/dL (ref 111–343)

## 2023-08-23 DIAGNOSIS — M5412 Radiculopathy, cervical region: Secondary | ICD-10-CM | POA: Diagnosis not present

## 2023-08-26 DIAGNOSIS — M5412 Radiculopathy, cervical region: Secondary | ICD-10-CM | POA: Diagnosis not present

## 2023-08-30 DIAGNOSIS — M5412 Radiculopathy, cervical region: Secondary | ICD-10-CM | POA: Diagnosis not present

## 2023-09-11 DIAGNOSIS — M5412 Radiculopathy, cervical region: Secondary | ICD-10-CM | POA: Diagnosis not present

## 2023-09-14 DIAGNOSIS — M5412 Radiculopathy, cervical region: Secondary | ICD-10-CM | POA: Diagnosis not present

## 2023-09-19 DIAGNOSIS — M5412 Radiculopathy, cervical region: Secondary | ICD-10-CM | POA: Diagnosis not present

## 2023-10-15 DIAGNOSIS — L821 Other seborrheic keratosis: Secondary | ICD-10-CM | POA: Diagnosis not present

## 2023-10-15 DIAGNOSIS — L814 Other melanin hyperpigmentation: Secondary | ICD-10-CM | POA: Diagnosis not present

## 2023-10-15 DIAGNOSIS — D225 Melanocytic nevi of trunk: Secondary | ICD-10-CM | POA: Diagnosis not present

## 2023-10-15 DIAGNOSIS — L570 Actinic keratosis: Secondary | ICD-10-CM | POA: Diagnosis not present

## 2023-10-15 DIAGNOSIS — B351 Tinea unguium: Secondary | ICD-10-CM | POA: Diagnosis not present

## 2023-11-21 ENCOUNTER — Other Ambulatory Visit: Payer: Self-pay | Admitting: Family Medicine

## 2023-11-21 DIAGNOSIS — I10 Essential (primary) hypertension: Secondary | ICD-10-CM

## 2024-01-22 NOTE — Progress Notes (Unsigned)
 SUBJECTIVE:   CHIEF COMPLAINT / HPI:  Jason Chang is a 58 y.o. male with a pertinent past medical history of T2DM, HLD, HTN presenting to the clinic for T2DM follow-up.  Type 2 diabetes mellitus - Prior A1c 6.4 on 07/15/23 - Medications: Jardiance  25 mg daily, semaglutide  0.25 mg weekly, metformin  1000 mg daily - Adherence: Good - Eye exam: Due - Foot exam: Due - Microalbumin: UTD, last WNL 9 mo ago - Statin: Atorvastatin  80 mg daily - No symptoms of hypoglycemia, polyuria, polydipsia, numbness of extremities, foot ulcers/trauma - Occasional tingling of the feet  Leg pain Entire bilateral legs hurting when working. Patient works as a soil scientist, he walks around a lot, multiple miles many days. Pain is worse with standing around, not particularly worsened by walking. Does not endorse typical claudication symptomatology. Was previously told he had bursitis in his right hip. L knee has had a few surgeries (meniscus tear, bone spurs), no replacement. States he sometimes has mild tingling in his feet, however does endorse sharp electrical pains traveling down his legs. Occasionally takes some acetaminophen  with relief, but fairly rarely.  Trips and falls in the woods sometimes, though this has not changed recently and always happens.  Patient notes that he does work around [employer] and roots a lot, believes this is the reason for his falls and does not have any issues coordination.   PERTINENT PMH / PSH: T2DM, HLD, HTN  *Remainder reviewed in problem list.   OBJECTIVE:   BP 135/84   Pulse 68   Wt 214 lb 6.4 oz (97.3 kg)   SpO2 100%   BMI 29.08 kg/m   General: Age-appropriate, resting comfortably in chair, NAD, alert and at baseline. Cardiovascular: Regular rate and rhythm. Normal S1/S2. No murmurs, rubs, or gallops appreciated. 2+ radial pulses. Pulmonary: Clear bilaterally to ascultation. No wheezes, crackles, or rhonchi. Normal WOB on room air. No accessory muscle  use. Skin: Warm and dry. Extremities: No peripheral edema bilaterally. 2+ DP and PT pulses bilaterally, feet warm to touch.  Capillary refill <2 seconds in bilateral feet.  Diabetic foot exam below. MSK: No pain with passive extension of bilateral knees.  Diabetic Foot Exam - Simple   Simple Foot Form Diabetic Foot exam was performed with the following findings: Yes 01/23/2024  9:02 AM  Visual Inspection No deformities, no ulcerations, no other skin breakdown bilaterally: Yes Sensation Testing Intact to touch and monofilament testing bilaterally: Yes See comments: Yes Pulse Check Posterior Tibialis and Dorsalis pulse intact bilaterally: Yes Comments Diabetic foot exam with monofilament shows intact sensation over all toes and plantar surfaces of feet bilaterally.  Has diminished location discrimination to point touch over 2nd through 5th toes on bilateral feet.  No ulcers or calluses present.    Results for orders placed or performed in visit on 01/23/24 (from the past 48 hours)  POCT glycosylated hemoglobin (Hb A1C)     Status: Abnormal   Collection Time: 01/23/24  8:23 AM  Result Value Ref Range   Hemoglobin A1C     HbA1c POC (<> result, manual entry)     HbA1c, POC (prediabetic range)     HbA1c, POC (controlled diabetic range) 8.1 (A) 0.0 - 7.0 %     ASSESSMENT/PLAN:   Assessment & Plan Type 2 diabetes mellitus without complication, without long-term current use of insulin (HCC) A1c increased to 8.1 from 6.4 today.  Poorly controlled. - Increase semaglutide  to 0.5 mg weekly, counseled on side effects - Continue metformin   and Jardiance  - Diabetic foot exam as above, overall no concerns - Recommended limiting sugary beverages and snacks - Follow-up in 6 months Primary osteoarthritis involving multiple joints Favor osteoarthritis given occupation walking, aching nature of pain.  Symptoms not consistent with sciatica.  No evidence of claudication on history and 2+ DP and PT  pulses bilaterally, though patient is at risk for PAD.  Did consider spinal stenosis, though no neurological dysfunction of bilateral lower extremities at this point.  If symptoms are worsening, would obtain radiography of lumbar spine and/or lower extremity joints depending on location of pain. - Conservative treatment for now, recommended Voltaren  topically, provided lidocaine  patches samples - Recommended acetaminophen  OTC up to Q6h if needed - Defer to PCP concerning ASA 81 mg - Return precautions provided if pain is worsening or changing quality Primary hypertension Well-controlled. - Continue olmesartan -amlodipine -HCTZ 20-5-12.5 mg  Follow-up in 6 months with PCP.  Jason Delo Toma, MD [facility]

## 2024-01-23 ENCOUNTER — Ambulatory Visit (INDEPENDENT_AMBULATORY_CARE_PROVIDER_SITE_OTHER): Admitting: Family Medicine

## 2024-01-23 VITALS — BP 135/84 | HR 68 | Wt 214.4 lb

## 2024-01-23 DIAGNOSIS — M150 Primary generalized (osteo)arthritis: Secondary | ICD-10-CM

## 2024-01-23 DIAGNOSIS — E119 Type 2 diabetes mellitus without complications: Secondary | ICD-10-CM | POA: Diagnosis not present

## 2024-01-23 DIAGNOSIS — I10 Essential (primary) hypertension: Secondary | ICD-10-CM

## 2024-01-23 LAB — POCT GLYCOSYLATED HEMOGLOBIN (HGB A1C): HbA1c, POC (controlled diabetic range): 8.1 % — AB (ref 0.0–7.0)

## 2024-01-23 MED ORDER — SEMAGLUTIDE(0.25 OR 0.5MG/DOS) 2 MG/3ML ~~LOC~~ SOPN
0.5000 mg | PEN_INJECTOR | SUBCUTANEOUS | 2 refills | Status: AC
Start: 2024-01-23 — End: ?

## 2024-01-23 NOTE — Patient Instructions (Signed)
 It was great to see you today! Thank you for choosing [facility] for your primary care.  Today we addressed: Diabetes Your A1c is up to 8.1 today.  For this, we will increase your Ozempic  (semaglutide ) to 0.5 mg.  Please keep taking this on the same day as before, just with increased dose.  As before, please let us  know if you have any increased nausea or stomach distress with this medicine.  Your feet look at today, please make sure that your boots are spacious and you wear moisture between socks if possible.  Leg pain Given your history of bursitis in your hip as well as your left knee problems, I suspect this pain is related to osteoarthritis or joint wear.  I would like you to take your over-the-counter Tylenol  a bit more often if needed, you can take up to 4 times a day, once every 6 hours.  Would also like you to use Voltaren  gel, which you can use frequently and liberally over your joints that we can.  Can also use lidocaine  patches if needed, I provided you a few samples just you can try them out.  You can wear these for 12 hours at a time then take them off.  They do become less sticky if you are very sweaty.  In the future, we can consider further testing if this does not improve with these interventions.  You should return to our clinic in 6 months to follow-up with your PCP or sooner if your leg pain is worsening or changing.  Thank you for coming to see us  at [facility] and for the opportunity to care for you! Monseratt Ledin, MD 01/23/2024, 8:49 AM

## 2024-01-23 NOTE — Assessment & Plan Note (Signed)
 Well-controlled. - Continue olmesartan -amlodipine -HCTZ 20-5-12.5 mg

## 2024-01-23 NOTE — Assessment & Plan Note (Addendum)
 Favor osteoarthritis given occupation walking, aching nature of pain.  Symptoms not consistent with sciatica.  No evidence of claudication on history and 2+ DP and PT pulses bilaterally, though patient is at risk for PAD.  Did consider spinal stenosis, though no neurological dysfunction of bilateral lower extremities at this point.  If symptoms are worsening, would obtain radiography of lumbar spine and/or lower extremity joints depending on location of pain. - Conservative treatment for now, recommended Voltaren  topically, provided lidocaine  patches samples - Recommended acetaminophen  OTC up to Q6h if needed - Defer to PCP concerning ASA 81 mg - Return precautions provided if pain is worsening or changing quality

## 2024-01-23 NOTE — Assessment & Plan Note (Signed)
 A1c increased to 8.1 from 6.4 today.  Poorly controlled. - Increase semaglutide  to 0.5 mg weekly, counseled on side effects - Continue metformin  and Jardiance  - Diabetic foot exam as above, overall no concerns - Recommended limiting sugary beverages and snacks - Follow-up in 6 months

## 2024-03-16 ENCOUNTER — Other Ambulatory Visit: Payer: Self-pay | Admitting: Family Medicine

## 2024-03-16 DIAGNOSIS — E785 Hyperlipidemia, unspecified: Secondary | ICD-10-CM

## 2024-03-17 ENCOUNTER — Other Ambulatory Visit: Payer: Self-pay

## 2024-03-17 MED ORDER — EMPAGLIFLOZIN 25 MG PO TABS: 25.0000 mg | ORAL_TABLET | Freq: Every day | ORAL | 3 refills | Status: AC

## 2024-04-20 ENCOUNTER — Ambulatory Visit: Admitting: Gastroenterology

## 2024-05-13 ENCOUNTER — Ambulatory Visit: Admitting: Physician Assistant
